# Patient Record
Sex: Female | Born: 1990 | Smoking: Never smoker
Health system: Southern US, Community
[De-identification: ages and names within clinical notes are randomized; demographics above are authoritative.]

## PROBLEM LIST (undated history)

## (undated) DIAGNOSIS — N760 Acute vaginitis: Secondary | ICD-10-CM

## (undated) DIAGNOSIS — B977 Papillomavirus as the cause of diseases classified elsewhere: Secondary | ICD-10-CM

## (undated) DIAGNOSIS — Z789 Other specified health status: Secondary | ICD-10-CM

## (undated) DIAGNOSIS — Z8619 Personal history of other infectious and parasitic diseases: Secondary | ICD-10-CM

## (undated) DIAGNOSIS — B9689 Other specified bacterial agents as the cause of diseases classified elsewhere: Secondary | ICD-10-CM

## (undated) DIAGNOSIS — R87619 Unspecified abnormal cytological findings in specimens from cervix uteri: Secondary | ICD-10-CM

## (undated) HISTORY — DX: Papillomavirus as the cause of diseases classified elsewhere: B97.7

## (undated) HISTORY — DX: Unspecified abnormal cytological findings in specimens from cervix uteri: R87.619

## (undated) HISTORY — DX: Personal history of other infectious and parasitic diseases: Z86.19

---
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
## 2010-09-07 LAB — GC/CHLAMYDIA PROBE AMP, GENITAL
Chlamydia, DNA Probe: NEGATIVE
GC Probe Amp, Genital: NEGATIVE

## 2010-09-07 LAB — WET PREP, GENITAL: Yeast Wet Prep HPF POC: NONE SEEN

## 2011-03-08 ENCOUNTER — Encounter: Payer: Self-pay | Admitting: Internal Medicine

## 2011-03-08 ENCOUNTER — Ambulatory Visit (INDEPENDENT_AMBULATORY_CARE_PROVIDER_SITE_OTHER): Payer: Federal, State, Local not specified - PPO | Admitting: Internal Medicine

## 2011-03-08 DIAGNOSIS — R002 Palpitations: Secondary | ICD-10-CM

## 2011-03-08 DIAGNOSIS — Z0000 Encounter for general adult medical examination without abnormal findings: Secondary | ICD-10-CM | POA: Insufficient documentation

## 2011-03-08 DIAGNOSIS — Z136 Encounter for screening for cardiovascular disorders: Secondary | ICD-10-CM

## 2011-03-08 DIAGNOSIS — K219 Gastro-esophageal reflux disease without esophagitis: Secondary | ICD-10-CM | POA: Insufficient documentation

## 2011-03-08 LAB — CBC WITH DIFFERENTIAL/PLATELET
Basophils Relative: 0.5 % (ref 0.0–3.0)
Eosinophils Absolute: 0 10*3/uL (ref 0.0–0.7)
Eosinophils Relative: 0.4 % (ref 0.0–5.0)
Hemoglobin: 12.4 g/dL (ref 12.0–15.0)
Lymphocytes Relative: 34.7 % (ref 12.0–46.0)
MCHC: 33.1 g/dL (ref 30.0–36.0)
MCV: 89.1 fl (ref 78.0–100.0)
Neutro Abs: 3.2 10*3/uL (ref 1.4–7.7)
Neutrophils Relative %: 53.2 % (ref 43.0–77.0)
RBC: 4.2 Mil/uL (ref 3.87–5.11)
WBC: 6.1 10*3/uL (ref 4.5–10.5)

## 2011-03-08 LAB — BASIC METABOLIC PANEL
CO2: 27 mEq/L (ref 19–32)
Calcium: 8.8 mg/dL (ref 8.4–10.5)
GFR: 155.87 mL/min (ref >60.00)
Glucose, Bld: 74 mg/dL (ref 70–99)
Potassium: 3.9 mEq/L (ref 3.5–5.1)
Sodium: 139 mEq/L (ref 135–145)

## 2011-03-08 LAB — LIPID PANEL: Total CHOL/HDL Ratio: 2

## 2011-03-08 MED ORDER — ESOMEPRAZOLE MAGNESIUM 40 MG PO CPDR: 40.0000 mg | DELAYED_RELEASE_CAPSULE | Freq: Every day | ORAL | Status: DC

## 2011-03-08 NOTE — Assessment & Plan Note (Addendum)
Frequent heartburn, recommend PPI for 6 weeks and then as needed. Samples and a prescription provided (nexium). Life style changes discussed

## 2011-03-08 NOTE — Assessment & Plan Note (Signed)
EKG normal sinus, no acute changes. Recommend to treat GERD, if symptoms continue will have to reassess the situation

## 2011-03-08 NOTE — Progress Notes (Signed)
  Subjective:    Patient ID: Sally Sexton, female    DOB: Sep 23, 1990, 20 y.o.   MRN: [id number]  HPI New patient, requests a complete physical exam For a while has noted palpitations ( described as increasing the heart rate) and chest pain after eating, symptoms last x5-10 minutes, and they are getting more frequent in the last 2 months. Denies associated odynophagia, faintiness, syncope, shortness of breath or cough. She does have some heartburn when the symptoms happen.  Past Medical History  Diagnosis Date  . History of chicken pox    Past Surgical History  Procedure Date  . No past surgeries    History   Social History  . Marital Status: Single    Spouse Name: N/A    Number of Children: 0  . 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 Smoking status: Never Smoker   . Smokeless tobacco: Never Used  . Alcohol Use: Yes     socially   . Drug Use: No  . Sexually Active: Not on file   Other Topics Concern  . Not on file   Social History Narrative   [place] from [place]--- diet:regular, ++ fast food ---exercise: no   Family History  Problem Relation Age of Onset  . Coronary artery disease Neg Hx   . Stroke Neg Hx   . Diabetes Neg Hx   . Colon cancer Neg Hx   . Breast cancer Neg Hx     Review of Systems No fever or or chills No cough or shortness of breath No nausea, vomiting, diarrhea or blood in the stools She experienced classic heartburn at least once a week (independent from palpitations and chest pain) she used to take medication for heartburn before. Denies depression, occasionally has some anxiety but she handles that well. Periods are regular and not heavy.     Objective:   Physical Exam  Constitutional: She is oriented to person, place, and time. She appears well-developed and well-nourished. No distress.  HENT:  Head: Normocephalic and atraumatic.  Neck: Normal range of motion.  Neck supple. No thyromegaly present.  Cardiovascular: Normal rate, regular rhythm and normal heart sounds.   No murmur heard. Pulmonary/Chest: Effort normal and breath sounds normal. No respiratory distress. She has no wheezes. She has no rales.  Abdominal: Soft. Bowel sounds are normal. She exhibits no distension. There is no tenderness. There is no rebound and no guarding.  Musculoskeletal: She exhibits no edema.  Neurological: She is alert and oriented to person, place, and time.  Skin: She is not diaphoretic.  Psychiatric: She has a normal mood and affect. Her behavior is normal. Judgment and thought content normal.          Assessment & Plan:

## 2011-03-08 NOTE — Progress Notes (Signed)
  Subjective:    Patient ID: Sally Sexton, female    DOB: Feb 06, 1991, 20 y.o.   MRN: [id number]  HPI    Review of Systems     Objective:   Physical Exam        Assessment & Plan:

## 2011-03-08 NOTE — Patient Instructions (Addendum)
nexium (or prilosec 20 mg OTC if nexium $$$) before breakfast x 6 weeks, then as needed . Call if chest pain continue

## 2011-03-08 NOTE — Assessment & Plan Note (Addendum)
TD ~ 3 years per pt Had menactra before she went to college per patient Declined a flu shot saw gynecology a  few months ago for a female checkup, no labs were performed Diet-exercise  discussed General labs

## 2011-05-02 ENCOUNTER — Emergency Department (HOSPITAL_COMMUNITY)
Admission: EM | Admit: 2011-05-02 | Discharge: 2011-05-02 | Discharge status: Against Medical Advice | Payer: Federal, State, Local not specified - PPO | Attending: Emergency Medicine | Admitting: Emergency Medicine

## 2011-05-02 ENCOUNTER — Encounter (HOSPITAL_COMMUNITY): Payer: Self-pay

## 2011-05-02 DIAGNOSIS — M549 Dorsalgia, unspecified: Secondary | ICD-10-CM | POA: Insufficient documentation
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
## 2011-06-23 ENCOUNTER — Encounter (HOSPITAL_COMMUNITY): Payer: Self-pay | Admitting: *Deleted

## 2011-06-23 ENCOUNTER — Other Ambulatory Visit (HOSPITAL_COMMUNITY): Payer: Self-pay | Admitting: Pharmacy Technician

## 2011-06-23 ENCOUNTER — Emergency Department (HOSPITAL_COMMUNITY)
Admission: EM | Admit: 2011-06-23 | Discharge: 2011-06-23 | Disposition: A | Discharge status: Home / Self Care | Payer: Federal, State, Local not specified - PPO | Attending: Emergency Medicine | Admitting: Emergency Medicine

## 2011-06-23 DIAGNOSIS — R6883 Chills (without fever): Secondary | ICD-10-CM | POA: Insufficient documentation

## 2011-06-23 DIAGNOSIS — R110 Nausea: Secondary | ICD-10-CM | POA: Insufficient documentation

## 2011-06-23 DIAGNOSIS — N898 Other specified noninflammatory disorders of vagina: Secondary | ICD-10-CM | POA: Insufficient documentation

## 2011-06-23 DIAGNOSIS — R109 Unspecified abdominal pain: Secondary | ICD-10-CM | POA: Insufficient documentation

## 2011-06-23 LAB — URINALYSIS, ROUTINE W REFLEX MICROSCOPIC
Glucose, UA: NEGATIVE mg/dL
Hgb urine dipstick: NEGATIVE
Leukocytes, UA: NEGATIVE
Protein, ur: NEGATIVE mg/dL
pH: 6 (ref 5.0–8.0)

## 2011-06-23 LAB — WET PREP, GENITAL
Trich, Wet Prep: NONE SEEN
Yeast Wet Prep HPF POC: NONE SEEN

## 2011-06-23 MED ORDER — ONDANSETRON 8 MG PO TBDP
8.0000 mg | ORAL_TABLET | Freq: Once | ORAL | Status: AC
  Administered 2011-06-23: 8 mg via ORAL
  Filled 2011-06-23: qty 1

## 2011-06-23 MED ORDER — ONDANSETRON 8 MG PO TBDP: 8.0000 mg | ORAL_TABLET | Freq: Three times a day (TID) | ORAL | Status: AC | PRN

## 2011-06-23 NOTE — ED Provider Notes (Signed)
History     CSN: [id number]  Arrival date & time 06/23/11  1919   First MD Initiated Contact with Patient 06/23/11 1943      Chief Complaint  Patient presents with  . Chills    pt reports having hot and cold flashes all day while at work. reports drinking ETOH last night but denies "normal hangover feeling." denies n/v. reports shaky feeling.     (Consider location/radiation/quality/duration/timing/severity/associated sxs/prior treatment) HPI History provided by pt.   Pt has had intermittent crampy and sharp, mid-abdominal pain since approx 4:00pm day.  Associated w/ nausea but no vomiting or diarrhea.  No GU sx w/ exception that her most recent period came 1wk late and was shorter than normal.  Started birth control yesterday but had not used any form of contraceptive prior.  Denies fever but has had alternating chills and diaphoresis since the pain started.  Pt has no PMH.  No h/o abdominal surgeries.    Past Medical History  Diagnosis Date  . History of chicken pox     Past Surgical History  Procedure Date  . No past surgeries     Family History  Problem Relation Age of Onset  . Coronary artery disease Neg Hx   . Stroke Neg Hx   . Diabetes Neg Hx   . Colon cancer Neg Hx   . Breast cancer Neg Hx     History  Substance Use Topics  . Smoking status: Never Smoker   . Smokeless tobacco: Never Used  . Alcohol Use: Yes     socially     OB History    Grav Para Term Preterm Abortions TAB SAB Ect Mult Living                  Review of Systems  All other systems reviewed and are negative.    Allergies  Review of patient's allergies indicates no known allergies.  Home Medications   Current Outpatient Rx  Name Route Sig Dispense Refill  . ESOMEPRAZOLE MAGNESIUM 40 MG PO CPDR Oral Take 1 capsule (40 mg total) by mouth daily. 30 capsule 6  . PRESCRIPTION MEDICATION  BCP- name of medication unknown       BP 119/60  Pulse 82  Temp(Src) 98.4 F (36.9 C) (Oral)   Resp 20  Wt 135 lb 8 oz (61.462 kg)  SpO2 100%  LMP 06/19/2011  Physical Exam  Nursing note and vitals reviewed. Constitutional: She is oriented to person, place, and time. She appears well-developed and well-nourished. No distress.  HENT:  Head: Normocephalic and atraumatic.  Eyes:       Normal appearance  Neck: Normal range of motion.  Cardiovascular: Normal rate and regular rhythm.   Pulmonary/Chest: Effort normal and breath sounds normal.  Abdominal: Soft. Bowel sounds are normal. She exhibits no distension and no mass. There is no tenderness. There is no rebound and no guarding.       No CVA tenderness  Genitourinary:       Nml external genitalia.  Physiologic vaginal discharge but no bleeding.  Cervix closed and appears nml.  No adnexal or cervical motion tenderess.    Neurological: She is alert and oriented to person, place, and time.  Skin: Skin is warm and dry. No rash noted.  Psychiatric: She has a normal mood and affect. Her behavior is normal.    ED Course  Procedures (including critical care time)  Labs Reviewed  URINALYSIS, ROUTINE W REFLEX MICROSCOPIC - Abnormal; Notable  for the following:    Ketones, ur 40 (*)    All other components within normal limits  WET PREP, GENITAL - Abnormal; Notable for the following:    Clue Cells Wet Prep HPF POC FEW (*)    WBC, Wet Prep HPF POC FEW (*)    All other components within normal limits  POCT PREGNANCY, URINE  GC/CHLAMYDIA PROBE AMP, GENITAL   No results found.   1. Abdominal pain   2. Nausea       MDM  Healthy 21yo F presents w/ c/o abdominal pain, nausea, lightheadedness and chills/sweats since early this evening.  Afebrile and abd benign/non-tender on exam.  Urine hcg neg but I am still suspicious for early pregnancy (pt just started oral contraceptive yesterday and does not use condoms, LMP 5 days ago but shorter than normal and came one week late).   U/A pending and will perform pelvic exam.     Normal  pelvic exam and U/A and wet prep neg for infection.  Pt reports relief of nausea w/ zofran.  She looks well.  D/c'd home w/ zofran and recommendation to drink plenty of fluids, recheck pregnancy test in one week and f/u with PCP if sx persistent.  Return precautions discussed.   Otilio Miu, [place] 06/24/11 0140

## 2011-06-24 LAB — GC/CHLAMYDIA PROBE AMP, GENITAL: GC Probe Amp, Genital: NEGATIVE

## 2011-06-25 NOTE — ED Provider Notes (Signed)
Medical screening examination/treatment/procedure(s) were performed by non-physician practitioner and as supervising physician I was immediately available for consultation/collaboration.   Jordayn Mink, MD 06/25/11 0715 

## 2011-07-22 ENCOUNTER — Encounter: Payer: Self-pay | Admitting: Family Medicine

## 2011-07-22 ENCOUNTER — Ambulatory Visit (INDEPENDENT_AMBULATORY_CARE_PROVIDER_SITE_OTHER): Payer: Federal, State, Local not specified - PPO | Admitting: Family Medicine

## 2011-07-22 VITALS — BP 100/60 | HR 85 | Temp 98.3°F | Wt 133.2 lb

## 2011-07-22 DIAGNOSIS — J069 Acute upper respiratory infection, unspecified: Secondary | ICD-10-CM

## 2011-07-22 DIAGNOSIS — R05 Cough: Secondary | ICD-10-CM

## 2011-07-22 MED ORDER — CEFUROXIME AXETIL 500 MG PO TABS: 500.0000 mg | ORAL_TABLET | Freq: Two times a day (BID) | ORAL | Status: AC

## 2011-07-22 MED ORDER — GUAIFENESIN-CODEINE 100-10 MG/5ML PO SYRP: ORAL_SOLUTION | ORAL | Status: DC

## 2011-07-22 NOTE — Progress Notes (Signed)
  Subjective:     Sally Sexton is a 21 y.o. female who presents for evaluation of sinus pain. Symptoms include: congestion, cough, facial pain, headaches, nasal congestion and sinus pressure. Onset of symptoms was 3 days ago. Symptoms have been gradually worsening since that time. Past history is significant for no history of pneumonia or bronchitis. Patient is a non-smoker.  The following portions of the patient's history were reviewed and updated as appropriate: allergies, current medications, past family history, past medical history, past social history, past surgical history and problem list.  Review of Systems Pertinent items are noted in HPI.   Objective:    BP 100/60  Pulse 85  Temp(Src) 98.3 F (36.8 C) (Oral)  Wt 133 lb 3.2 oz (60.419 kg)  SpO2 97%  LMP 06/19/2011 General appearance: alert, cooperative, appears stated age and no distress Eyes: conjunctivae/corneas clear. PERRL, EOM's intact. Fundi benign. Ears: normal TM's and external ear canals both ears Nose: clear discharge, mild congestion, sinus tenderness bilateral Throat: lips, mucosa, and tongue normal; teeth and gums normal Neck: no adenopathy and thyroid not enlarged, symmetric, no tenderness/mass/nodules Lungs: clear to auscultation bilaterally Heart: S1, S2 normal    Assessment:    Acute viral sinusitis.    Plan:    Nasal steroids per medication orders. Antihistamines per medication orders. Follow up in a few days or as needed.

## 2011-07-22 NOTE — Patient Instructions (Signed)

## 2011-11-22 ENCOUNTER — Ambulatory Visit: Payer: Federal, State, Local not specified - PPO | Admitting: Internal Medicine

## 2011-11-29 ENCOUNTER — Ambulatory Visit (INDEPENDENT_AMBULATORY_CARE_PROVIDER_SITE_OTHER): Payer: Federal, State, Local not specified - PPO | Admitting: Internal Medicine

## 2011-11-29 ENCOUNTER — Encounter: Payer: Self-pay | Admitting: Internal Medicine

## 2011-11-29 VITALS — BP 104/68 | HR 78 | Temp 98.4°F | Wt 135.0 lb

## 2011-11-29 DIAGNOSIS — J069 Acute upper respiratory infection, unspecified: Secondary | ICD-10-CM

## 2011-11-29 MED ORDER — AMOXICILLIN 500 MG PO CAPS: 1000.0000 mg | ORAL_CAPSULE | Freq: Two times a day (BID) | ORAL | Status: AC

## 2011-11-29 NOTE — Progress Notes (Signed)
  Subjective:    Patient ID: Sally Sexton, female    DOB: 07-Oct-1990, 21 y.o.   MRN: [id number]  HPI Acute visit Symptoms started 2 days ago: Body aches, sore throat, head congestion. Feeling tired.  Past Medical History  Diagnosis Date  . History of chicken pox      Review of Systems No fever or chills No nausea, vomiting, diarrhea. Some cough, no chest congestion but has some phlegm  accumulated in the throat.     Objective:   Physical Exam  General -- alert, well-developed, and well-nourished.   HEENT -- TMs slt bulge, nored, throat w/o redness, face symmetric and not tender to palpation Lungs -- normal respiratory effort, no intercostal retractions, no accessory muscle use, and normal breath sounds.   Heart-- normal rate, regular rhythm, no murmur, and no gallop.   extremities-- no pretibial edema bilaterally Psych-- Cognition and judgment appear intact. Alert and cooperative with normal attention span and concentration.  not anxious appearing and not depressed appearing.       Assessment & Plan:   Symptoms consistent with URI, see instructions.

## 2011-11-29 NOTE — Patient Instructions (Signed)
Rest, fluids , tylenol For cough, take Mucinex DM twice a day as needed  For congestion use nasonex,  2 nasal sprays once a day a day until you feel better (sample) May like to use some Sudafed behind the counter if the congestion is severe Take the antibiotic as prescribed  (Amoxicillin) only if no better in few days  Call if no better in few days Call anytime if the symptoms are severe

## 2012-05-23 ENCOUNTER — Ambulatory Visit (INDEPENDENT_AMBULATORY_CARE_PROVIDER_SITE_OTHER): Payer: Federal, State, Local not specified - PPO | Admitting: Internal Medicine

## 2012-05-23 ENCOUNTER — Encounter: Payer: Self-pay | Admitting: Internal Medicine

## 2012-05-23 VITALS — BP 104/76 | HR 77 | Temp 98.0°F | Wt 143.0 lb

## 2012-05-23 DIAGNOSIS — J029 Acute pharyngitis, unspecified: Secondary | ICD-10-CM

## 2012-05-23 NOTE — Addendum Note (Signed)
Addended by: Edwena Felty T on: 05/23/2012 01:40 PM   Modules accepted: Orders

## 2012-05-23 NOTE — Progress Notes (Signed)
  Subjective:    Patient ID: Sally Sexton, female    DOB: 1990-12-24, 21 y.o.   MRN: [id number]  HPI Acute visit Patient was seen at a UC  in [place] 05/18/2012 with sore throat, tonsils were enlarged and had white patches. They did a finger prick blood test and she was diagnosed with mono. She was also told her spleen was enlarged. Was prescribed prednisone which she is finishing.  Past Medical History  Diagnosis Date  . History of chicken pox    Past Surgical History  Procedure Date  . No past surgeries    History   Social History  . Marital Status: Single    Spouse Name: N/A    Number of Children: 0  . 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 Smoking status: Never Smoker   . Smokeless tobacco: Never Used  . Alcohol Use: Yes     Comment: socially   . Drug Use: No  . Sexually Active: Not on file   Other Topics Concern  . Not on file   Social History Narrative   [place]--- diet:regular, ++ fast food ---exercise: no    Review of Systems At the time of the urgent care visit, she had no fever at all, did not feel fatigue. Didn't have nausea, vomiting, diarrhea. No cough or runny nose. At this point she feels completely well.     Objective:   Physical Exam General -- alert, well-developed, and well-nourished.   HEENT --  throat w/o redness, tonsils small, no discharge; face symmetric and not tender to palpation  Lungs -- normal respiratory effort, no intercostal retractions, no accessory muscle use, and normal breath sounds.   Heart-- normal rate, regular rhythm, no murmur, and no gallop.   Abdomen--soft, non-tender, no distention, no masses, no organomegaly upon palpation or percussion. I put the patient on the  right decubitus and I still was unable to palpate the spleen.   Extremities-- no pretibial edema bilaterally  Psych-- Cognition and judgment appear intact. Alert  and cooperative with normal attention span and concentration.  not anxious appearing and not depressed appearing.      Assessment & Plan:   Pharyngitis, tonsillitis The patient reports a history consistent with pharyngitis, tonsillitis; evidently  a mono spot test was positive but I'm not sure if she actually had mono given the lack of fever and tiredness. She was also told her spleen was enlarged, today examination is normal. Plan: Throat culture, Rx  PCN if appropriate  Avoid any contact sports for the next 4 weeks due to to history of spleen enlargement. This is more a precaution because on today's exam there is no evidence of splenomegaly.

## 2012-05-23 NOTE — Patient Instructions (Addendum)
Please call anytime if you have fever, sore throat, nausea, vomiting, a rash. No contact sports for the next 4 weeks

## 2012-05-26 ENCOUNTER — Encounter: Payer: Self-pay | Admitting: *Deleted

## 2012-05-26 LAB — CULTURE, GROUP A STREP

## 2012-07-05 ENCOUNTER — Ambulatory Visit: Payer: Federal, State, Local not specified - PPO | Admitting: Internal Medicine

## 2012-08-25 ENCOUNTER — Ambulatory Visit (INDEPENDENT_AMBULATORY_CARE_PROVIDER_SITE_OTHER): Payer: Federal, State, Local not specified - PPO | Admitting: Internal Medicine

## 2012-08-25 ENCOUNTER — Encounter: Payer: Self-pay | Admitting: Internal Medicine

## 2012-08-25 VITALS — BP 104/64 | HR 78 | Temp 98.2°F | Ht 68.0 in | Wt 148.0 lb

## 2012-08-25 DIAGNOSIS — N920 Excessive and frequent menstruation with regular cycle: Secondary | ICD-10-CM

## 2012-08-25 DIAGNOSIS — Z0000 Encounter for general adult medical examination without abnormal findings: Secondary | ICD-10-CM

## 2012-08-25 DIAGNOSIS — N926 Irregular menstruation, unspecified: Secondary | ICD-10-CM

## 2012-08-25 DIAGNOSIS — R82998 Other abnormal findings in urine: Secondary | ICD-10-CM

## 2012-08-25 LAB — CBC WITH DIFFERENTIAL/PLATELET
Basophils Relative: 0 % (ref 0–1)
Eosinophils Absolute: 0 10*3/uL (ref 0.0–0.7)
Eosinophils Relative: 1 % (ref 0–5)
Hemoglobin: 12.8 g/dL (ref 12.0–15.0)
Lymphs Abs: 2.9 10*3/uL (ref 0.7–4.0)
MCH: 28.4 pg (ref 26.0–34.0)
MCHC: 33.3 g/dL (ref 30.0–36.0)
MCV: 85.1 fL (ref 78.0–100.0)
Monocytes Relative: 12 % (ref 3–12)
Platelets: 219 10*3/uL (ref 150–400)
RBC: 4.51 MIL/uL (ref 3.87–5.11)

## 2012-08-25 LAB — COMPREHENSIVE METABOLIC PANEL
ALT: 21 U/L (ref 0–35)
AST: 21 U/L (ref 0–37)
Albumin: 4.1 g/dL (ref 3.5–5.2)
Alkaline Phosphatase: 48 U/L (ref 39–117)
BUN: 9 mg/dL (ref 6–23)
Chloride: 105 mEq/L (ref 96–112)
Potassium: 3.9 mEq/L (ref 3.5–5.3)

## 2012-08-25 NOTE — Progress Notes (Signed)
  Subjective:    Patient ID: Sally Sexton, female    DOB: [birth year], 22 y.o.   MRN: [id number]  HPI Complete physical exam  Past Medical History  Diagnosis Date  . History of chicken pox    Past Surgical History  Procedure Laterality Date  . No past surgeries     History   Social History  . Marital Status: Single    Spouse Name: N/A    Number of Children: 0  . Years of Education: N/A   Occupational History  . [employer] , graduate [phone]   . [employer] part time    Social History Main Topics  . Smoking status: Never Smoker   . Smokeless tobacco: Never Used  . Alcohol Use: Yes     Comment: socially   . Drug Use: No  . Sexually Active: Not on file   Other Topics Concern  . Not on file   Social History Narrative   [place] from [place]   G1P0   Exercise: no regular , active at work, plans to start a routine next week   Diet:regular, has cut down on fast food , less fat            Family History  Problem Relation Age of Onset  . Coronary artery disease Neg Hx   . Stroke Neg Hx   . Diabetes Neg Hx   . Colon cancer Neg Hx   . Breast cancer Neg Hx    Review of Systems Denies fever or chills. Did complain of some dizziness and nausea on and off for 2 weeks. Her last normal menstrual period was 07/19/2012, on 08/17/2012 she only spotted. Currently with no vaginal discharge or bleeding. Today, has some low abdominal cramping. No dysuria or gross hematuria. No diarrhea. Denies at site of depression. Occasional cough.     Objective:   Physical Exam General -- alert, well-developed  .   Neck --no thyromegaly , normal carotid pulse  HEENT -- no pale or jaundice Lungs -- normal respiratory effort, no intercostal retractions, no accessory muscle use, and normal breath sounds.   Heart-- normal rate, regular rhythm, no murmur, and no gallop.   Abdomen--soft, non-tender, no distention, no masses, no HSM, no guarding, and no rigidity.   Extremities-- no pretibial edema  bilaterally Neurologic-- alert & oriented X3 and strength normal in all extremities. Psych-- Cognition and judgment appear intact. Alert and cooperative with normal attention span and concentration.  not anxious appearing and not depressed appearing.       Assessment & Plan:

## 2012-08-25 NOTE — Assessment & Plan Note (Addendum)
TD ~ 2010per pt significant wt gain, likely due to lack of exercise. Plans to start a exercise routine-- praised Info regards a healthy diet provided  LMP abnormal, saw the school doctor early this week, blood preg test negative.  UPT today (-) Udip today + leukocytes, checkin a UC[facility] rec  To see gynecology and to call if mild nausea dizziness persist

## 2012-08-25 NOTE — Patient Instructions (Addendum)
Please see your gynecologist

## 2012-08-26 ENCOUNTER — Encounter: Payer: Self-pay | Admitting: Internal Medicine

## 2012-08-26 LAB — URINALYSIS
Ketones, ur: NEGATIVE mg/dL
Nitrite: NEGATIVE
Specific Gravity, Urine: 1.021 (ref 1.005–1.030)
Urobilinogen, UA: 1 mg/dL (ref 0.0–1.0)
pH: 7.5 (ref 5.0–8.0)

## 2012-08-28 ENCOUNTER — Telehealth: Payer: Self-pay | Admitting: *Deleted

## 2012-08-28 LAB — URINE CULTURE: Colony Count: >=100000

## 2012-08-28 MED ORDER — SULFAMETHOXAZOLE-TRIMETHOPRIM 800-160 MG PO TABS: 1.0000 | ORAL_TABLET | Freq: Two times a day (BID) | ORAL | Status: DC

## 2012-08-28 NOTE — Telephone Encounter (Signed)
Wanda Plump, MD - call Bactrim DS 1 po bid x 5 days, #10, no RF   rx sent to pharmacy.

## 2012-10-24 ENCOUNTER — Ambulatory Visit: Payer: Federal, State, Local not specified - PPO | Admitting: Internal Medicine

## 2012-10-24 ENCOUNTER — Ambulatory Visit (INDEPENDENT_AMBULATORY_CARE_PROVIDER_SITE_OTHER): Payer: Federal, State, Local not specified - PPO | Admitting: Emergency Medicine

## 2012-10-24 ENCOUNTER — Other Ambulatory Visit: Payer: Self-pay | Admitting: Emergency Medicine

## 2012-10-24 ENCOUNTER — Ambulatory Visit: Payer: Federal, State, Local not specified - PPO

## 2012-10-24 VITALS — BP 118/64 | HR 82 | Temp 98.7°F | Resp 16 | Ht 68.5 in | Wt 151.0 lb

## 2012-10-24 DIAGNOSIS — R109 Unspecified abdominal pain: Secondary | ICD-10-CM

## 2012-10-24 LAB — POCT UA - MICROSCOPIC ONLY
Casts, Ur, LPF, POC: NEGATIVE
Crystals, Ur, HPF, POC: NEGATIVE
Mucus, UA: NEGATIVE

## 2012-10-24 LAB — POCT CBC
Granulocyte percent: 53.4 %G (ref 37–80)
Hemoglobin: 12.9 g/dL (ref 12.2–16.2)
MCHC: 31 g/dL — AB (ref 31.8–35.4)
MPV: 9.6 fL (ref 0–99.8)
POC Granulocyte: 3.4 (ref 2–6.9)
POC MID %: 8.5 %M (ref 0–12)
RBC: 4.51 M/uL (ref 4.04–5.48)

## 2012-10-24 LAB — POCT URINALYSIS DIPSTICK
Blood, UA: NEGATIVE
Protein, UA: NEGATIVE
Spec Grav, UA: 1.015
Urobilinogen, UA: 0.2
pH, UA: 7

## 2012-10-24 LAB — POCT URINE PREGNANCY: Preg Test, Ur: NEGATIVE

## 2012-10-24 MED ORDER — POLYETHYLENE GLYCOL 3350 17 GM/SCOOP PO POWD: 17.0000 g | Freq: Every day | ORAL | Status: DC

## 2012-10-24 NOTE — Progress Notes (Signed)
[facility] [address] [place] [address] [phone]  Date:  10/24/2012   Name:  Sally Sexton   DOB:  Jun 01, 1990   MRN:  [id number]  PCP:  Willow Ora, MD    Chief Complaint: Abdominal Pain   History of Present Illness:  Sally Sexton is a 22 y.o. very pleasant female patient who presents with the following:  Ill past two weeks with LLQ abdominal pain and cramping.  Hard stools.  Some blood on one occasion.  No fever chills, nausea or vomiting.  Normal appetite.  No food intolerance.  No anal masses or pain.  No cough or coryza or gu or gyn symptoms.  No improvement with over the counter medications or other home remedies. Denies other complaint or health concern today.   Patient Active Problem List   Diagnosis Date Noted  . Annual physical exam 03/08/2011  . GERD (gastroesophageal reflux disease) 03/08/2011    Past Medical History  Diagnosis Date  . History of chicken pox     Past Surgical History  Procedure Laterality Date  . No past surgeries      History  Substance Use Topics  . Smoking status: Never Smoker   . Smokeless tobacco: Never Used  . Alcohol Use: Yes     Comment: socially     Family History  Problem Relation Age of Onset  . Coronary artery disease Neg Hx   . Stroke Neg Hx   . Diabetes Neg Hx   . Colon cancer Neg Hx   . Breast cancer Neg Hx     No Known Allergies  Medication list has been reviewed and updated.  No current outpatient prescriptions on file prior to visit.   No current facility-administered medications on file prior to visit.    Review of Systems:  As per HPI, otherwise negative.    Physical Examination: Filed Vitals:   10/24/12 1357  BP: 118/64  Pulse: 82  Temp: 98.7 F (37.1 C)  Resp: 16   Filed Vitals:   10/24/12 1357  Height: 5' 8.5" (1.74 m)  Weight: 151 lb (68.493 kg)   Body mass index is 22.62 kg/(m^2). Ideal Body Weight: Weight in (lb) to have BMI = 25: 166.5  GEN: WDWN,  NAD, Non-toxic, A & O x 3 HEENT: Atraumatic, Normocephalic. Neck supple. No masses, No LAD. Ears and Nose: No external deformity. CV: RRR, No M/G/R. No JVD. No thrill. No extra heart sounds. PULM: CTA B, no wheezes, crackles, rhonchi. No retractions. No resp. distress. No accessory muscle use. ABD: S, NT, ND, +BS. No rebound. No HSM. EXTR: No c/c/e NEURO Normal gait.  PSYCH: Normally interactive. Conversant. Not depressed or anxious appearing.  Calm demeanor.    Assessment and Plan: Abdominal pain Constipation miralax   Signed,  Phillips Odor, MD   Results for orders placed in visit on 10/24/12  POCT UA - MICROSCOPIC ONLY      Result Value Range   WBC, Ur, HPF, POC 0-2     RBC, urine, microscopic neg     Bacteria, U Microscopic trace     Mucus, UA neg     Epithelial cells, urine per micros 0-3     Crystals, Ur, HPF, POC neg     Casts, Ur, LPF, POC neg     Yeast, UA neg    POCT URINALYSIS DIPSTICK      Result Value Range   Color, UA yellow     Clarity, UA clear  Glucose, UA neg     Bilirubin, UA neg     Ketones, UA neg     Spec Grav, UA 1.015     Blood, UA neg     pH, UA 7.0     Protein, UA neg     Urobilinogen, UA 0.2     Nitrite, UA neg     Leukocytes, UA Negative    POCT CBC      Result Value Range   WBC 6.3  4.6 - 10.2 K/uL   Lymph, poc 2.4  0.6 - 3.4   POC LYMPH PERCENT 38.1  10 - 50 %L   MID (cbc) 0.5  0 - 0.9   POC MID % 8.5  0 - 12 %M   POC Granulocyte 3.4  2 - 6.9   Granulocyte percent 53.4  37 - 80 %G   RBC 4.51  4.04 - 5.48 M/uL   Hemoglobin 12.9  12.2 - 16.2 g/dL   HCT, POC 14.7  82.9 - 47.9 %   MCV 92.2  80 - 97 fL   MCH, POC 28.6  27 - 31.2 pg   MCHC 31.0 (*) 31.8 - 35.4 g/dL   RDW, POC 56.2     Platelet Count, POC 191  142 - 424 K/uL   MPV 9.6  0 - 99.8 fL  POCT URINE PREGNANCY      Result Value Range   Preg Test, Ur Negative     [facility] reading (PRIMARY) by  Dr. Dareen Piano.  Increased stool.  No free air or air fluid levels.

## 2012-10-24 NOTE — Patient Instructions (Addendum)

## 2013-01-15 ENCOUNTER — Encounter (HOSPITAL_COMMUNITY): Payer: Self-pay | Admitting: *Deleted

## 2013-01-15 ENCOUNTER — Other Ambulatory Visit (HOSPITAL_COMMUNITY)
Admission: RE | Admit: 2013-01-15 | Discharge: 2013-01-15 | Disposition: A | Discharge status: Home / Self Care | Payer: Federal, State, Local not specified - PPO | Source: Ambulatory Visit | Attending: Emergency Medicine | Admitting: Emergency Medicine

## 2013-01-15 ENCOUNTER — Emergency Department (INDEPENDENT_AMBULATORY_CARE_PROVIDER_SITE_OTHER)
Admission: EM | Admit: 2013-01-15 | Discharge: 2013-01-15 | Disposition: A | Discharge status: Home / Self Care | Payer: Federal, State, Local not specified - PPO | Source: Home / Self Care | Attending: Emergency Medicine | Admitting: Emergency Medicine

## 2013-01-15 DIAGNOSIS — N898 Other specified noninflammatory disorders of vagina: Secondary | ICD-10-CM

## 2013-01-15 DIAGNOSIS — Z113 Encounter for screening for infections with a predominantly sexual mode of transmission: Secondary | ICD-10-CM | POA: Insufficient documentation

## 2013-01-15 DIAGNOSIS — N760 Acute vaginitis: Secondary | ICD-10-CM | POA: Insufficient documentation

## 2013-01-15 LAB — POCT URINALYSIS DIP (DEVICE)
Bilirubin Urine: NEGATIVE
Leukocytes, UA: NEGATIVE
Nitrite: NEGATIVE
Protein, ur: NEGATIVE mg/dL
pH: 6 (ref 5.0–8.0)

## 2013-01-15 MED ORDER — METRONIDAZOLE 500 MG PO TABS: 500.0000 mg | ORAL_TABLET | Freq: Two times a day (BID) | ORAL | Status: DC

## 2013-01-15 MED ORDER — FLUCONAZOLE 150 MG PO TABS: 150.0000 mg | ORAL_TABLET | Freq: Once | ORAL | Status: AC

## 2013-01-15 NOTE — ED Provider Notes (Signed)
CSN: [id number]     Arrival date & time 01/15/13  1636 History   First MD Initiated Contact with Patient 01/15/13 1655     Chief Complaint  Patient presents with  . Vaginal Discharge   (Consider location/radiation/quality/duration/timing/severity/associated sxs/prior Treatment) HPI Comments: Patient presents urgent care describing a new lead develop vaginal discharge for the last 6-7 days. She describes a for the last 6 months she's been having recurrent vaginal infections with abundant discharge most of the time have been diagnosed with bacterial vaginosis. She has seen her gynecologist because of that and have been even been trying boric acid capsules. She describes on this occasion she does have some mild vaginal itching and discharge is more than usual been white and thick. She has a very low risk or suspicion of having a choir as a transmitted illness.  Patient denies any fevers pelvic pain or urinary symptoms such as increased frequency burning and pressure with urination. She also expresses that she thinks that the last time she used MetroGel she injured herself on the right side of her lobular menorah. And sustained a small cut that since then has been healing but is still feels sore  Patient is a 22 y.o. female presenting with vaginal discharge.  Vaginal Discharge Quality:  Manson Passey, clear, milky, malodorous, thick and white Severity:  Moderate Onset quality:  Gradual Timing:  Constant Progression:  Waxing and waning Context: not after intercourse, not after urination, not at rest, not during bowel movement and not during intercourse   Relieved by:  Nothing Ineffective treatments:  None tried Associated symptoms: no abdominal pain, no dyspareunia, no dysuria, no genital lesions, no nausea, no rash, no urinary incontinence and no vaginal itching   Risk factors: no endometriosis, no foreign body, no gynecological surgery, no immunosuppression, no PID, no prior miscarriage, no STI, no  terminated pregnancy and no unprotected sex     Past Medical History  Diagnosis Date  . History of chicken pox    Past Surgical History  Procedure Laterality Date  . No past surgeries    . Wisdom tooth extraction  2009   Family History  Problem Relation Age of Onset  . Coronary artery disease Neg Hx   . Stroke Neg Hx   . Diabetes Neg Hx   . Colon cancer Neg Hx   . Breast cancer Neg Hx    History  Substance Use Topics  . Smoking status: Never Smoker   . Smokeless tobacco: Never Used  . Alcohol Use: Yes     Comment: socially    OB History   Grav Para Term Preterm Abortions TAB SAB Ect Mult Living                 Review of Systems  Constitutional: Negative for activity change and appetite change.  Gastrointestinal: Negative for nausea and abdominal pain.  Genitourinary: Positive for vaginal discharge. Negative for bladder incontinence, dysuria, urgency, frequency, flank pain, vaginal bleeding, menstrual problem, pelvic pain and dyspareunia.    Allergies  Review of patient's allergies indicates no known allergies.  Home Medications   Current Outpatient Rx  Name  Route  Sig  Dispense  Refill  . fluconazole (DIFLUCAN) 150 MG tablet   Oral   Take 1 tablet (150 mg total) by mouth once. Take second tablet in 7 days   2 tablet   0   . metroNIDAZOLE (FLAGYL) 500 MG tablet   Oral   Take 500 mg by mouth 3 (three) times daily.         Marland Kitchen  metroNIDAZOLE (FLAGYL) 500 MG tablet   Oral   Take 1 tablet (500 mg total) by mouth 2 (two) times daily.   14 tablet   0   . polyethylene glycol powder (GLYCOLAX/MIRALAX) powder   Oral   Take 17 g by mouth daily.   3350 g   1    BP 107/62  Pulse 64  Temp(Src) 98.8 F (37.1 C) (Oral)  Resp 12  SpO2 99%  LMP 12/11/2012 Physical Exam  Vitals reviewed. Constitutional: Vital signs are normal. She appears well-developed and well-nourished.  Non-toxic appearance. She does not have a sickly appearance. She does not appear ill.  No distress.  Abdominal: Hernia confirmed negative in the right inguinal area and confirmed negative in the left inguinal area.  Genitourinary:    No labial fusion. There is no rash, tenderness, lesion or injury on the right labia. There is no rash, tenderness or injury on the left labia. No erythema, tenderness or bleeding around the vagina. No foreign body around the vagina. No signs of injury around the vagina. Vaginal discharge found.  Musculoskeletal: She exhibits tenderness.  Lymphadenopathy:       Right: No inguinal adenopathy present.       Left: No inguinal adenopathy present.  Neurological: She is alert.  Skin: No rash noted. No erythema.    ED Course  Procedures (including critical care time) Labs Review Labs Reviewed  POCT URINALYSIS DIP (DEVICE)  POCT PREGNANCY, URINE  CERVICOVAGINAL ANCILLARY ONLY   Imaging Review No results found.  MDM   1. Vaginal discharge    Recurrent vaginitis.  Exam was somewhat consistent with bacterial vaginosis versus Candida vaginitis. Will treat patient empirically for both. Patient agrees to do so. She has been otherwise if she will be contacted if abnormal test results will require further treatment. She has been instructed to also call her gynecologist as she continues to experience this recurrent episodes of vaginitis. She agrees.  Discharge Medication List as of 01/15/2013  5:36 PM    START taking these medications   Details  fluconazole (DIFLUCAN) 150 MG tablet Take 1 tablet (150 mg total) by mouth once. Take second tablet in 7 days, Starting 01/15/2013, Print    !! metroNIDAZOLE (FLAGYL) 500 MG tablet Take 1 tablet (500 mg total) by mouth 2 (two) times daily., Starting 01/15/2013, Until Discontinued, Print     !! - Potential duplicate medications found. Please discuss with provider.        Jimmie Molly, MD 01/15/13 [phone]

## 2013-01-15 NOTE — ED Notes (Signed)
C/o vaginal discharge for 1 week.  States she gets bacterial vaginosis every month.  C/o vaginal itching and discharge is more than usual, white and thick.  She thinks she has a yeast infection also.

## 2013-01-16 NOTE — ED Notes (Signed)
GC/Chlamydia neg., Affirm: Candida and Trich neg., Gardnerella pos. Pt. adequately treated with Flagyl. [name] 01/16/2013

## 2013-01-31 ENCOUNTER — Telehealth: Payer: Self-pay | Admitting: Internal Medicine

## 2013-01-31 NOTE — Telephone Encounter (Signed)
Patient Information:  Caller Name: Sally Sexton  Phone: [phone]  Patient: Sally Sexton  Gender: Female  DOB: 04-Apr-1991  Age: 22 Years  PCP: Willow Ora  Pregnant: No  Office Follow Up:  Does the office need to follow up with this patient?: No  Instructions For The Office: N/A  RN Note:  Used Numbness/Tingling protocol. Disposition: See provider within 72 hours due to gradual onset numbness in an extremity. Will call office 9-11 to schedule as wants afternoon appointment.  Symptoms  Reason For Call & Symptoms: Has noticed numbness below elbow ffor past "2 weeks". Is intermittent. Thinks has more numbness after certain foods "but can't be sure".  Reviewed Health History In EMR: N/A  Reviewed Medications In EMR: N/A  Reviewed Allergies In EMR: Yes  Reviewed Surgeries / Procedures: Yes  Date of Onset of Symptoms: 01/17/2013 OB / GYN:  LMP: 01/22/2013  Guideline(s) Used:  No Protocol Available - Sick Adult  Disposition Per Guideline:   See Within 3 Days in Office  Reason For Disposition Reached:   Nursing judgment  Advice Given:  N/A  RN Overrode Recommendation:  Follow Up With Office Later  Will call office 9-11 to schedule own appointment.

## 2013-01-31 NOTE — Telephone Encounter (Signed)
It is noted that the patient will call the office tomorrow.      KP

## 2013-02-01 NOTE — Telephone Encounter (Signed)
[place], call pt, needs an appointment

## 2013-02-02 ENCOUNTER — Telehealth: Payer: Self-pay | Admitting: *Deleted

## 2013-02-02 NOTE — Telephone Encounter (Signed)
Appt made for 02/03/13 at [facility] for Saturday clinic

## 2013-02-02 NOTE — Telephone Encounter (Signed)
Spoke with pt who is willing to be seen at Saturday clinic by Dr. Selena Batten. Schedule not yet open, advised that I would call her back to schedule appt later today.

## 2013-02-02 NOTE — Telephone Encounter (Signed)
Pt scheduled for Saturday clinic w/Padonda Orvan Falconer.

## 2013-02-03 ENCOUNTER — Ambulatory Visit: Payer: Federal, State, Local not specified - PPO | Admitting: Family

## 2013-02-07 ENCOUNTER — Encounter: Payer: Self-pay | Admitting: Nurse Practitioner

## 2013-02-07 ENCOUNTER — Ambulatory Visit (INDEPENDENT_AMBULATORY_CARE_PROVIDER_SITE_OTHER): Payer: Federal, State, Local not specified - PPO | Admitting: Nurse Practitioner

## 2013-02-07 VITALS — BP 113/69 | HR 76 | Temp 98.0°F | Ht 68.5 in | Wt 158.8 lb

## 2013-02-07 DIAGNOSIS — K219 Gastro-esophageal reflux disease without esophagitis: Secondary | ICD-10-CM

## 2013-02-07 DIAGNOSIS — Z8659 Personal history of other mental and behavioral disorders: Secondary | ICD-10-CM

## 2013-02-07 DIAGNOSIS — J039 Acute tonsillitis, unspecified: Secondary | ICD-10-CM

## 2013-02-07 MED ORDER — OMEPRAZOLE 40 MG PO CPDR: 40.0000 mg | DELAYED_RELEASE_CAPSULE | Freq: Every day | ORAL | Status: DC

## 2013-02-07 NOTE — Patient Instructions (Addendum)
I think your arm numbness is related to muscle tightness that occurs from hours of sitting at your desk. Stretch for 5 minutes every hour-upper body, shoulder & neck stretches. It is possible that arm numbness is related to anxiety. Use deep breathing techniques if you feel a pins & needles sensation. Consider seeing your therapist again if these episodes occur more than 1-2 times monthly.  Regarding reflux, start omeprazole daily to create a low acid environment. Please follow up with Dr. Drue Novel in 4 weeks to determine if you need to continue omeprazole. Also, eat at least 1-2 spoons full of vanilla yogurt before drinking coffee in the morning. Abstain from alcoholic beverages for a few weeks, then limit to 1 drink daily. Do not eat until you feel overly full-keep meals small. Take tylenol for headache. Avoid ibuprophen, aspirin, aleve, and excedrin for a few weeks. If you must use ibuprophen for menstrual cramps, try decreasing dose to 1 or 2 tabs & take with food. Take 30 minute walk every day. This will help with weight gain and improve digestion. For tonsillitis, start Neilmed sinus washes daily. Salt water gargles several times daily:1/4 tsp salt with 1/4 cup warm water. Also use listerene gargles to decrease bacteria.  Gastroesophageal Reflux Disease, Adult Gastroesophageal reflux disease (GERD) happens when acid from your stomach flows up into the esophagus. When acid comes in contact with the esophagus, the acid causes soreness (inflammation) in the esophagus. Over time, GERD may create small holes (ulcers) in the lining of the esophagus. CAUSES   Increased body weight. This puts pressure on the stomach, making acid rise from the stomach into the esophagus.  Smoking. This increases acid production in the stomach.  Drinking alcohol. This causes decreased pressure in the lower esophageal sphincter (valve or ring of muscle between the esophagus and stomach), allowing acid from the stomach into the  esophagus.  Late evening meals and a full stomach. This increases pressure and acid production in the stomach.  A malformed lower esophageal sphincter. Sometimes, no cause is found. SYMPTOMS   Burning pain in the lower part of the mid-chest behind the breastbone and in the mid-stomach area. This may occur twice a week or more often.  Trouble swallowing.  Sore throat.  Dry cough.  Asthma-like symptoms including chest tightness, shortness of breath, or wheezing. DIAGNOSIS  Your caregiver may be able to diagnose GERD based on your symptoms. In some cases, X-rays and other tests may be done to check for complications or to check the condition of your stomach and esophagus. TREATMENT  Your caregiver may recommend over-the-counter or prescription medicines to help decrease acid production. Ask your caregiver before starting or adding any new medicines.  HOME CARE INSTRUCTIONS   Change the factors that you can control. Ask your caregiver for guidance concerning weight loss, quitting smoking, and alcohol consumption.  Avoid foods and drinks that make your symptoms worse, such as:  Caffeine or alcoholic drinks.  Chocolate.  Peppermint or mint flavorings.  Garlic and onions.  Spicy foods.  Citrus fruits, such as oranges, lemons, or limes.  Tomato-based foods such as sauce, chili, salsa, and pizza.  Fried and fatty foods.  Avoid lying down for the 3 hours prior to your bedtime or prior to taking a nap.  Eat small, frequent meals instead of large meals.  Wear loose-fitting clothing. Do not wear anything tight around your waist that causes pressure on your stomach.  Raise the head of your bed 6 to 8 inches with  wood blocks to help you sleep. Extra pillows will not help.  Only take over-the-counter or prescription medicines for pain, discomfort, or fever as directed by your caregiver.  Do not take aspirin, ibuprofen, or other nonsteroidal anti-inflammatory drugs (NSAIDs). SEEK  IMMEDIATE MEDICAL CARE IF:   You have pain in your arms, neck, jaw, teeth, or back.  Your pain increases or changes in intensity or duration.  You develop nausea, vomiting, or sweating (diaphoresis).  You develop shortness of breath, or you faint.  Your vomit is green, yellow, black, or looks like coffee grounds or blood.  Your stool is red, bloody, or black. These symptoms could be signs of other problems, such as heart disease, gastric bleeding, or esophageal bleeding. MAKE SURE YOU:   Understand these instructions.  Will watch your condition.  Will get help right away if you are not doing well or get worse. Document Released: 02/17/2005 Document Revised: 08/02/2011 Document Reviewed: 11/27/2010 [facility] Patient Information 2014 [place], [place].

## 2013-02-07 NOTE — Progress Notes (Signed)
Subjective:     Sally Sexton is an 22 y.o. female who presents with multiple complaints: L arm numbness that occurs while at work only, occasional pins & needles sensation in legs, full feeling in neck times 1 day, bloating and heartburn, occasional palpitations. She has been treated for anxiety in the past with medication and CBT. She denies history of panick attacks. Her reflux and occasional palpitations have been present for at least 2 years. She had normal ECG in 2012. She was prescribed PPI 2 years ago, but does not remember if she took it. She drinks coffee daily, ETOH every other day, took excedrin several times weekly while in college (1 year ago). Currently uses 600 mg ibuprophen once monthly for [facility]. She has gained 20 pounds in the last year. 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  She denies chest pain, diaphoresis and SOB associated with palpitations. She does not associate palpitations with any particular activity.  The following portions of the patient's history were reviewed and updated as appropriate: allergies, current medications, past medical history, past social history, past surgical history and problem list.  Review of Systems Constitutional: positive for weight gain, negative for fatigue, fevers and night sweats Respiratory: negative for cough, dyspnea on exertion, sputum and wheezing Cardiovascular: positive for palpitations, negative for chest pain, dyspnea, exertional chest pressure/discomfort, fatigue, lower extremity edema and near-syncope Gastrointestinal: positive for dyspepsia and reflux symptoms, negative for change in bowel habits, constipation, diarrhea and nausea Hematologic/lymphatic: positive for felt like glands in neck were swollen yesterday, negative for easy bruising and petechiae Musculoskeletal:positive for pins & needles sensation in legs, negative for arthralgias and muscle weakness Neurological: positive  for paresthesia, negative for dizziness, seizures, tremors and weakness Behavioral/Psych: feels anxious at times, worries about health, treated for anxiety in past w/meds & CBT Endocrine: negative for temperature intolerance Allergic/Immunologic: negative, c/o itchy ears   Objective:     BP 113/69  Pulse 76  Temp(Src) 98 F (36.7 C) (Oral)  Ht 5' 8.5" (1.74 m)  Wt 158 lb 12.8 oz (72.031 kg)  BMI 23.79 kg/m2  SpO2 98%  LMP 12/22/2012 General appearance: alert, cooperative, appears stated age and no distress Head: Normocephalic, without obvious abnormality, atraumatic Eyes: negative findings: lids and lashes normal, conjunctivae and sclerae normal and corneas clear Ears: normal TM's and external ear canals both ears Nose: Nares normal. Septum midline. Mucosa normal. No drainage or sinus tenderness. Throat: abnormal findings: scant exudate L tonsil. tonsils +2 Neck: trachea midline, thyroid feels full, anterior cervical LAD L Back: no kyphosis present, no scoliosis present, no skin lesions, erythema, or scars, no tenderness to percussion or palpation, range of motion normal, symmetric, no curvature. ROM normal. No CVA tenderness. Extremities: extremities normal, atraumatic, no cyanosis or edema Skin: Skin color, texture, turgor normal. No rashes or lesions Lymph nodes: Cervical adenopathy: L anterior cervical LAD   Assessment:    Gastroesophageal Reflux Disease- heartburn, bloating Weight gain 20 pounds in 1 year, TSH normal 4/14 Anxiety-pins &needles sensation in legs, occasional palpitations Tonsillitis-exudate L tonsil, L anterior LAD L arm numbness-occurs at work, normal ECG 2012 WG:[id number] tension, anxiety    Plan:   1 omeprazole times 4 weeks, diet changes, f/u 4 weeks. See pt instruction 2 Exercise 30 minutes daily, portion control 3 deep breathing, consider CBT again 4 nasal saline washes, salt water & listerene rinses 5 upper body stretches every hour while at work

## 2013-03-29 ENCOUNTER — Other Ambulatory Visit: Payer: Self-pay

## 2013-05-08 ENCOUNTER — Encounter: Payer: Self-pay | Admitting: Internal Medicine

## 2013-05-08 ENCOUNTER — Other Ambulatory Visit: Payer: Self-pay | Admitting: Internal Medicine

## 2013-05-08 ENCOUNTER — Ambulatory Visit (INDEPENDENT_AMBULATORY_CARE_PROVIDER_SITE_OTHER): Payer: Federal, State, Local not specified - PPO | Admitting: Internal Medicine

## 2013-05-08 VITALS — BP 109/67 | HR 80 | Temp 98.0°F | Wt 157.0 lb

## 2013-05-08 DIAGNOSIS — D229 Melanocytic nevi, unspecified: Secondary | ICD-10-CM | POA: Insufficient documentation

## 2013-05-08 DIAGNOSIS — R002 Palpitations: Secondary | ICD-10-CM

## 2013-05-08 DIAGNOSIS — D239 Other benign neoplasm of skin, unspecified: Secondary | ICD-10-CM

## 2013-05-08 DIAGNOSIS — K219 Gastro-esophageal reflux disease without esophagitis: Secondary | ICD-10-CM

## 2013-05-08 DIAGNOSIS — Z8659 Personal history of other mental and behavioral disorders: Secondary | ICD-10-CM

## 2013-05-08 MED ORDER — OMEPRAZOLE 40 MG PO CPDR
40.0000 mg | DELAYED_RELEASE_CAPSULE | Freq: Every day | ORAL | Status: DC
Start: 2013-05-08 — End: 2013-05-08

## 2013-05-08 NOTE — Progress Notes (Signed)
   Subjective:    Patient ID: Sally Sexton, female    DOB: 1991-05-21, 22 y.o.   MRN: [id number]  HPI Acute visit to discuss several issues. Several months history of GERD described as acid regurgitation and burning in her throat, previously tried omeprazole, she took it after her coffee in the morning, did not help. Currently taking only OTCs. Symptoms are triggered by certain foods and alcohol. Also, has noted palpitations on and off, usually at rest, heart rate has been as high as 121,, not associated with chest pain, SOB; reports that when she feels her heart rate going up she feels anxious but is not anxious before the symptoms. Also has several moles, there is one in particular at the  right foot, it has not change in size color or shape in years..  Past Medical History  Diagnosis Date  . History of chicken pox    Past Surgical History  Procedure Laterality Date  . Wisdom tooth extraction  2009   History   Social History  . Marital Status: Single    Spouse Name: N/A    Number of Children: 0  . Years of Education: N/A   Occupational History  . [employer] Laws , graduate [phone]   . [employer]   .     Social History Main Topics  . Smoking status: Never Smoker   . Smokeless tobacco: Never Used  . Alcohol Use: Yes     Comment: socially   . Drug Use: No  . Sexual Activity: Yes    Birth Control/ Protection: None   Other Topics Concern  . Not on file   Social History Narrative   Original from [place]   G1P0      Review of Systems Denies weight loss or tremors. Drinks one coffee a day, and no other caffeine beverages. Denies any syncope, she exercises on and off without apparent problems. On further questioning, she does have low tolerance to anxiety, she can get very anxious with minor issues.     Objective:   Physical Exam BP 109/67  Pulse 80  Temp(Src) 98 F (36.7 C)  Wt 157 lb (71.215 kg)  SpO2 100% General -- alert, well-developed, NAD.  Neck --no  thyromegaly  Lungs -- normal respiratory effort, no intercostal retractions, no accessory muscle use, and normal breath sounds.  Heart-- normal rate, regular rhythm, no murmur.  Abdomen-- Not distended, good bowel sounds,soft, non-tender.  Extremities-- no pretibial edema bilaterally  Neurologic--  alert & oriented X3. Speech normal, gait normal, strength normal in all extremities. No tremor. Skin--  she has a x10 mm mole at the plantar area of the right foot, not elevated. Psych-- Cognition and judgment appear intact. Cooperative with normal attention span and concentration. Slt anxious appearing , no depressed appearing.      Assessment & Plan:

## 2013-05-08 NOTE — Telephone Encounter (Signed)
rx refilled per protocol. DJR  

## 2013-05-08 NOTE — Assessment & Plan Note (Signed)
Patient has a number of moles, surveillance recommended

## 2013-05-08 NOTE — Assessment & Plan Note (Signed)
The patient is probably more  anxious than what she thinks, recommend to see her counselor

## 2013-05-08 NOTE — Progress Notes (Signed)
Pre visit review using our clinic review tool, if applicable. No additional management support is needed unless otherwise documented below in the visit note. 

## 2013-05-08 NOTE — Assessment & Plan Note (Addendum)
Recurrent issue, Previously CBC and TSH normal. EKG today nsr  Related to anxiety?. Plan: Holter monitor

## 2013-05-08 NOTE — Patient Instructions (Signed)
Start omeprazole 40 mg one tablet It is before breakfast Avoid any habits that cause acid reflux Come back in 6 weeks        Diet for Gastroesophageal Reflux Disease, Adult Reflux (acid reflux) is when acid from your stomach flows up into the esophagus. When acid comes in contact with the esophagus, the acid causes irritation and soreness (inflammation) in the esophagus. When reflux happens often or so severely that it causes damage to the esophagus, it is called gastroesophageal reflux disease (GERD). Nutrition therapy can help ease the discomfort of GERD. FOODS OR DRINKS TO AVOID OR LIMIT  Smoking or chewing tobacco. Nicotine is one of the most potent stimulants to acid production in the gastrointestinal tract.  Caffeinated and decaffeinated coffee and black tea.  Regular or low-calorie carbonated beverages or energy drinks (caffeine-free carbonated beverages are allowed).   Strong spices, such as black pepper, white pepper, red pepper, cayenne, curry powder, and chili powder.  Peppermint or spearmint.  Chocolate.  High-fat foods, including meats and fried foods. Extra added fats including oils, butter, salad dressings, and nuts. Limit these to less than 8 tsp per day.  Fruits and vegetables if they are not tolerated, such as citrus fruits or tomatoes.  Alcohol.  Any food that seems to aggravate your condition. If you have questions regarding your diet, call your caregiver or a registered dietitian. OTHER THINGS THAT MAY HELP GERD INCLUDE:   Eating your meals slowly, in a relaxed setting.  Eating 5 to 6 small meals per day instead of 3 large meals.  Eliminating food for a period of time if it causes distress.  Not lying down until 3 hours after eating a meal.  Keeping the head of your bed raised 6 to 9 inches (15 to 23 cm) by using a foam wedge or blocks under the legs of the bed. Lying flat may make symptoms worse.  Being physically active. Weight loss may be helpful  in reducing reflux in overweight or obese adults.  Wear loose fitting clothing EXAMPLE MEAL PLAN This meal plan is approximately 2,000 calories based on https://www.bernard.org/ meal planning guidelines. Breakfast   cup cooked oatmeal.  1 cup strawberries.  1 cup low-fat milk.  1 oz almonds. Snack  1 cup cucumber slices.  6 oz yogurt (made from low-fat or fat-free milk). Lunch  2 slice whole-wheat bread.  2 oz sliced [place].  2 tsp mayonnaise.  1 cup blueberries.  1 cup snap peas. Snack  6 whole-wheat crackers.  1 oz string cheese. Dinner   cup brown rice.  1 cup mixed veggies.  1 tsp olive oil.  3 oz grilled fish. Document Released: 05/10/2005 Document Revised: 08/02/2011 Document Reviewed: 03/26/2011 [facility] Patient Information 2014 [place], [place].  Moles Moles are usually harmless growths on the skin. They are accumulations of color (pigment) cells in the skin that:   Can be various colors, from light brown to black.  Can appear anywhere on the body.  May remain flat or become raised.  May contain hairs.  May remain smooth or develop wrinkling. Most moles are not cancerous (benign). However, some moles may develop changes and become cancerous. It is important to check your moles every month. If you check your moles regularly, you will be able to notice any changes that may occur.  CAUSES  Moles occur when skin cells grow together in clusters instead of spreading out in the skin as they normally do. The reason for this clustering is unknown. DIAGNOSIS  Your caregiver will perform a skin examination to diagnose your mole.  TREATMENT  Moles usually do not require treatment. If a mole becomes worrisome, your caregiver may choose to take a sample of the mole or remove it entirely, and then send it to a lab for examination.  HOME CARE INSTRUCTIONS  Check your mole(s) monthly for changes that may indicate skin cancer. These changes can include:  A  change in size.  A change in color. Note that moles tend to darken during pregnancy or when taking birth control pills (oral contraception).  A change in shape.  A change in the border of the mole.  Wear sunscreen (with an SPF of at least 30) when you spend long periods of time outside. Reapply the sunscreen every 2 3 hours.  Schedule annual appointments with your skin doctor (dermatologist) if you have a large number of moles. SEEK MEDICAL CARE IF:  Your mole changes size, especially if it becomes larger than a pencil eraser.  Your mole changes in color or develops more than one color.  Your mole becomes itchy or bleeds.  Your mole, or the skin near the mole, becomes painful, sore, red, or swollen.  Your mole becomes scaly, sheds skin, or oozes fluid.  Your mole develops irregular borders.  Your mole becomes flat or develops raised areas.  Your mole becomes hard or soft. Document Released: 02/02/2001 Document Revised: 02/02/2012 Document Reviewed: 11/22/2011 [facility] Patient Information 2014 [place], [place].

## 2013-05-08 NOTE — Assessment & Plan Note (Signed)
Ongoing symptoms. Strongly recommend to avoid known triggers. PPIs on a empty stomach 30 minutes before breakfast daily Diet information provided

## 2013-05-09 ENCOUNTER — Encounter: Payer: Self-pay | Admitting: Internal Medicine

## 2013-05-15 ENCOUNTER — Telehealth: Payer: Self-pay | Admitting: Internal Medicine

## 2013-05-15 NOTE — Telephone Encounter (Signed)
Patient states that she spoke with her insurance company and was told that they needed something from Dr. Drue Novel stating that it was medically necessary for her to be on the Holter monitor in order for them to cover. Asked patient if she had a fax number or if her insurance had sent prior auth form. She did not have this information and stated that she would call us back with additional information.

## 2013-05-29 NOTE — Telephone Encounter (Addendum)
Will prepare a letter once she give [place] a fax number

## 2013-06-11 ENCOUNTER — Encounter: Payer: Self-pay | Admitting: *Deleted

## 2013-06-11 ENCOUNTER — Encounter (INDEPENDENT_AMBULATORY_CARE_PROVIDER_SITE_OTHER): Payer: Federal, State, Local not specified - PPO

## 2013-06-11 DIAGNOSIS — R002 Palpitations: Secondary | ICD-10-CM

## 2013-06-11 NOTE — Progress Notes (Signed)
Patient ID: Sally Sexton, female   DOB: March 23, 1991, 23 y.o.   MRN: [id number] E-Cardio 48 hour holter monitor applied to patient.

## 2013-08-10 ENCOUNTER — Ambulatory Visit (INDEPENDENT_AMBULATORY_CARE_PROVIDER_SITE_OTHER): Payer: Federal, State, Local not specified - PPO | Admitting: Family Medicine

## 2013-08-10 ENCOUNTER — Encounter: Payer: Self-pay | Admitting: Family Medicine

## 2013-08-10 VITALS — BP 110/70 | HR 90 | Temp 98.1°F | Wt 154.0 lb

## 2013-08-10 DIAGNOSIS — S139XXA Sprain of joints and ligaments of unspecified parts of neck, initial encounter: Secondary | ICD-10-CM

## 2013-08-10 DIAGNOSIS — J069 Acute upper respiratory infection, unspecified: Secondary | ICD-10-CM

## 2013-08-10 DIAGNOSIS — S161XXA Strain of muscle, fascia and tendon at neck level, initial encounter: Secondary | ICD-10-CM

## 2013-08-10 MED ORDER — LORATADINE 10 MG PO TABS: 10.0000 mg | ORAL_TABLET | Freq: Every day | ORAL | Status: DC

## 2013-08-10 MED ORDER — TRIAMCINOLONE ACETONIDE 55 MCG/ACT NA AERO: 2.0000 | INHALATION_SPRAY | Freq: Every day | NASAL | Status: DC

## 2013-08-10 MED ORDER — METHOCARBAMOL 500 MG PO TABS: 500.0000 mg | ORAL_TABLET | Freq: Four times a day (QID) | ORAL | Status: DC | PRN

## 2013-08-10 NOTE — Patient Instructions (Signed)

## 2013-08-10 NOTE — Progress Notes (Signed)
Pre visit review using our clinic review tool, if applicable. No additional management support is needed unless otherwise documented below in the visit note. 

## 2013-08-10 NOTE — Progress Notes (Signed)
  Subjective:     Sally Sexton is a 23 y.o. female who presents for evaluation of symptoms of a URI. Symptoms include bilateral ear pressure/pain, congestion, nasal congestion, post nasal drip and sinus pressure. Onset of symptoms was 2 days ago, and has been gradually worsening since that time. Treatment to date: antihistamines.  The following portions of the patient's history were reviewed and updated as appropriate: allergies, current medications, past family history, past medical history, past social history, past surgical history and problem list.  Review of Systems Pertinent items are noted in HPI.   Objective:    BP 110/70  Pulse 90  Temp(Src) 98.1 F (36.7 C) (Oral)  Wt 154 lb (69.854 kg)  SpO2 97% General appearance: alert, cooperative, appears stated age and no distress Ears: normal TM's and external ear canals both ears Nose: clear discharge, mild congestion, turbinates red, swollen, no sinus tenderness Throat: lips, mucosa, and tongue normal; teeth and gums normal Neck: no adenopathy, supple, symmetrical, trachea midline and thyroid not enlarged, symmetric, no tenderness/mass/nodules Lungs: clear to auscultation bilaterally Heart: S1, S2 normal   Assessment:    allergic rhinitis   Plan:    Suggested symptomatic OTC remedies. Nasal saline spray for congestion. Nasal steroids per orders. Follow up as needed.   1. Neck strain robaxin - Ambulatory referral to Chiropractic  2. Acute upper respiratory infections of unspecified site  - triamcinolone (NASACORT AQ) 55 MCG/ACT AERO nasal inhaler; Place 2 sprays into the nose daily.  Dispense: 1 Inhaler; Refill: 12 - loratadine (CLARITIN) 10 MG tablet; Take 1 tablet (10 mg total) by mouth daily.  Dispense: 30 tablet; Refill: 11

## 2013-09-07 ENCOUNTER — Telehealth: Payer: Self-pay

## 2013-09-10 ENCOUNTER — Encounter: Payer: Self-pay | Admitting: Internal Medicine

## 2013-09-10 ENCOUNTER — Ambulatory Visit (INDEPENDENT_AMBULATORY_CARE_PROVIDER_SITE_OTHER): Payer: Federal, State, Local not specified - PPO | Admitting: Internal Medicine

## 2013-09-10 VITALS — BP 100/64 | HR 90 | Temp 97.9°F | Ht 68.0 in | Wt 152.0 lb

## 2013-09-10 DIAGNOSIS — D229 Melanocytic nevi, unspecified: Secondary | ICD-10-CM

## 2013-09-10 DIAGNOSIS — Z23 Encounter for immunization: Secondary | ICD-10-CM

## 2013-09-10 DIAGNOSIS — R002 Palpitations: Secondary | ICD-10-CM

## 2013-09-10 DIAGNOSIS — Z0000 Encounter for general adult medical examination without abnormal findings: Secondary | ICD-10-CM

## 2013-09-10 NOTE — Patient Instructions (Signed)
Schedule labs, fasting FLP-- dx V70  Next visit is for a physical exam in 2 years  fasting Please make an appointment

## 2013-09-10 NOTE — Assessment & Plan Note (Signed)
She monitor her moles, has not been no change, specifically there is a mole at the right foot that has been unchanged for years

## 2013-09-10 NOTE — Progress Notes (Signed)
Pre visit review using our clinic review tool, if applicable. No additional management support is needed unless otherwise documented below in the visit note. 

## 2013-09-10 NOTE — Progress Notes (Signed)
   Subjective:    Patient ID: Sally Sexton, female    DOB: Sep 29, 1990, 23 y.o.   MRN: [id number]  DOS:  09/10/2013 Type of  visit: Complete physical exam  ROS Diet-- imrpoved Exercise-- going to the gym No  CP, SOB No palpitations, no lower extremity edema Denies  nausea, vomiting diarrhea Denies  blood in the stools  (-) cough, sputum production (-) wheezing, chest congestion No dysuria, gross hematuria, difficulty urinating  No anxiety, depression     Past Medical History  Diagnosis Date  . History of chicken pox     Past Surgical History  Procedure Laterality Date  . Wisdom tooth extraction  2009    History   Social History  . Marital Status: Single    Spouse Name: N/A    Number of Children: 0  . Years of Education: N/A   Occupational History  . [employer] , graduate [phone]   . [employer]    Social History Main Topics  . Smoking status: Never Smoker   . Smokeless tobacco: Never Used  . Alcohol Use: Yes     Comment: socially   . Drug Use: No  . Sexual Activity: Yes    Birth Control/ Protection: None   Other Topics Concern  . Not on file   Social History Narrative   Original from [place], lives w/ boyfriend    G1P0      Family History  Problem Relation Age of Onset  . Coronary artery disease Neg Hx   . Stroke Neg Hx   . Diabetes Neg Hx   . Colon cancer Neg Hx   . Breast cancer Neg Hx        Medication List       This list is accurate as of: 09/10/13  5:39 PM.  Always use your most recent med list.               omeprazole 40 MG capsule  Commonly known as:  PRILOSEC  TAKE ONE CAPSULE BY MOUTH DAILY           Objective:   Physical Exam BP 100/64  Pulse 90  Temp(Src) 97.9 F (36.6 C)  Ht 5\' 8"  (1.727 m)  Wt 152 lb (68.947 kg)  BMI 23.12 kg/m2  SpO2 100%  General -- alert, well-developed, NAD.  Neck --no thyromegaly   HEENT-- Not pale.   Heart-- normal rate, regular rhythm, no murmur.  Abdomen-- Not distended, good bowel  sounds,soft, non-tender. Extremities-- no pretibial edema bilaterally  Neurologic--  alert & oriented X3. Speech normal, gait normal, strength normal in all extremities.  Psych-- Cognition and judgment appear intact. Cooperative with normal attention span and concentration. No anxious or depressed appearing.     Assessment & Plan:

## 2013-09-10 NOTE — Assessment & Plan Note (Addendum)
TD ~ 2015  gynecology Dr Garwin Brothers  Doing great with diet and exercise, Has lost some weight. Will check an FLP otherwise return to the office in 2 years and as needed

## 2013-09-10 NOTE — Assessment & Plan Note (Signed)
Currently asymptomatic, Holter monitorFebruary 2015 negative. In retrospect, patient thinks she was very anxious

## 2013-09-10 NOTE — Telephone Encounter (Signed)
Unable to reach prior to visit  

## 2013-09-14 ENCOUNTER — Other Ambulatory Visit (INDEPENDENT_AMBULATORY_CARE_PROVIDER_SITE_OTHER): Payer: Federal, State, Local not specified - PPO

## 2013-09-14 ENCOUNTER — Other Ambulatory Visit: Payer: Federal, State, Local not specified - PPO

## 2013-09-14 DIAGNOSIS — Z0000 Encounter for general adult medical examination without abnormal findings: Secondary | ICD-10-CM

## 2013-09-14 LAB — LIPID PANEL
CHOL/HDL RATIO: 3
Cholesterol: 144 mg/dL (ref 0–200)
HDL: 53.5 mg/dL (ref >39.00)
LDL Cholesterol: 85 mg/dL (ref 0–99)
Triglycerides: 30 mg/dL (ref 0.0–149.0)
VLDL: 6 mg/dL (ref 0.0–40.0)

## 2013-11-27 LAB — OB RESULTS CONSOLE ABO/RH: RH Type: POSITIVE

## 2013-11-27 LAB — OB RESULTS CONSOLE HEPATITIS B SURFACE ANTIGEN: HEP B S AG: NEGATIVE

## 2013-11-27 LAB — OB RESULTS CONSOLE HIV ANTIBODY (ROUTINE TESTING): HIV: NONREACTIVE

## 2013-11-27 LAB — OB RESULTS CONSOLE RUBELLA ANTIBODY, IGM: Rubella: IMMUNE

## 2013-11-27 LAB — OB RESULTS CONSOLE RPR: RPR: NONREACTIVE

## 2014-03-16 IMAGING — CR DG ABDOMEN ACUTE W/ 1V CHEST
3 series · 3 of 3 positions shown · non-contrast
Comparison: None.

CLINICAL DATA: Abdominal pain

ACUTE ABDOMEN SERIES (ABDOMEN 2 VIEW & CHEST 1 VIEW)

[PA]
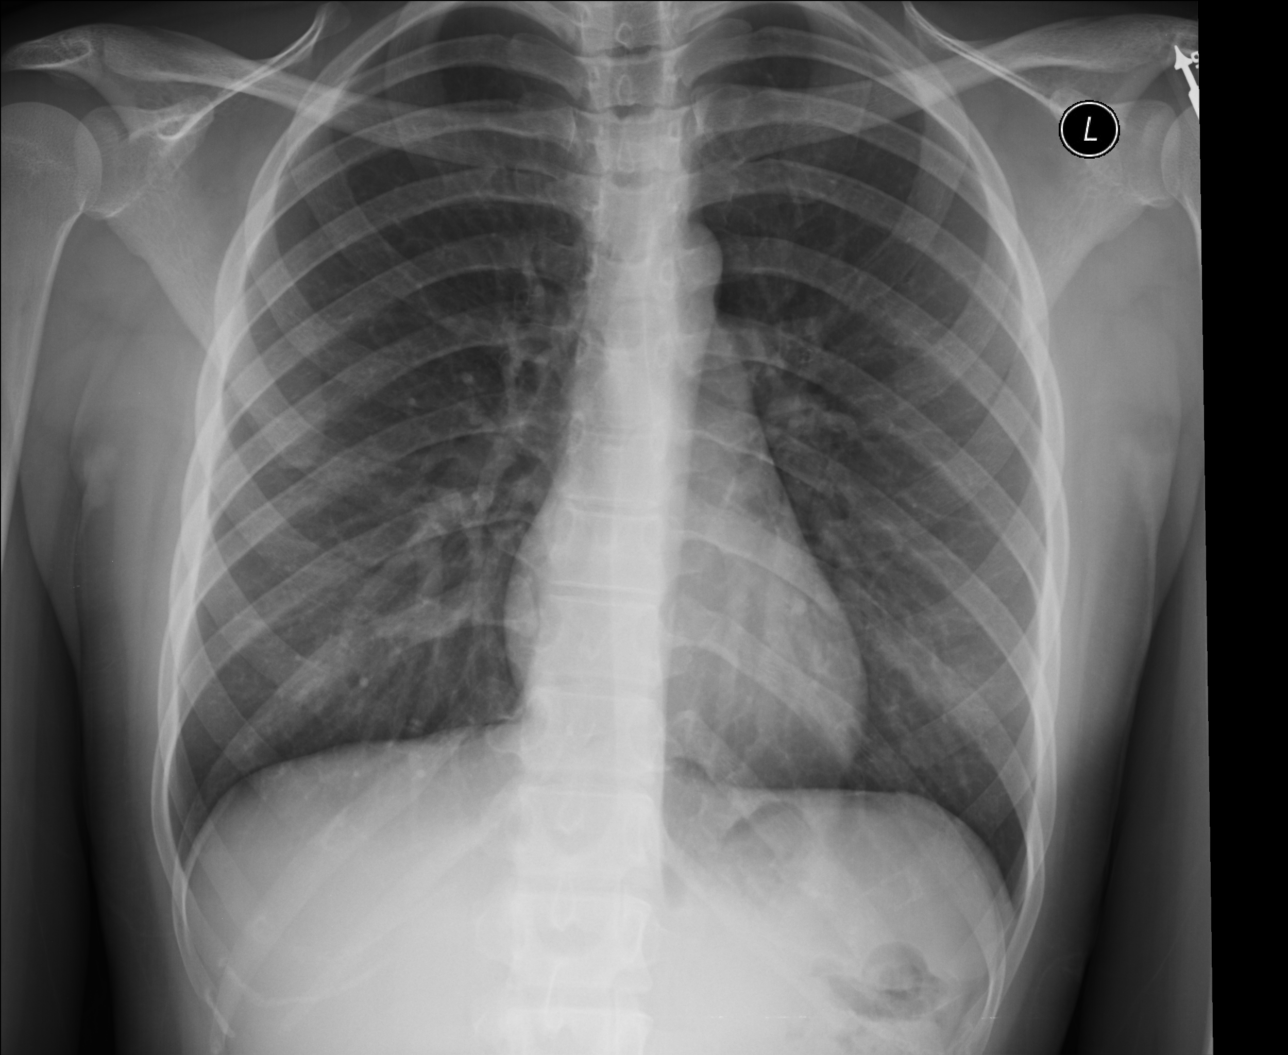

[AP (1 of 2)]
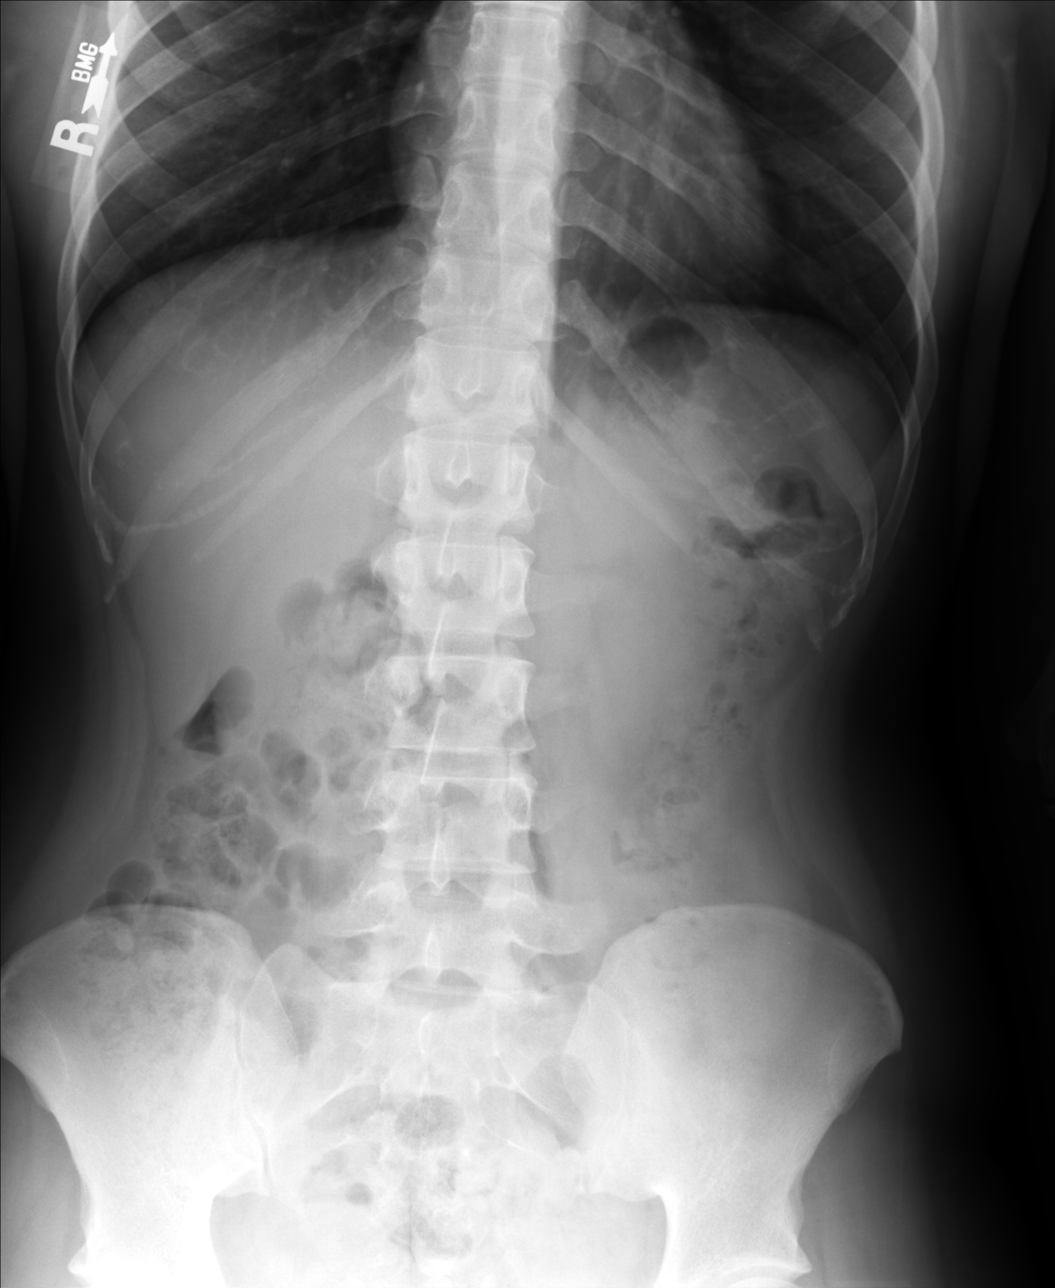

[AP (2 of 2)]
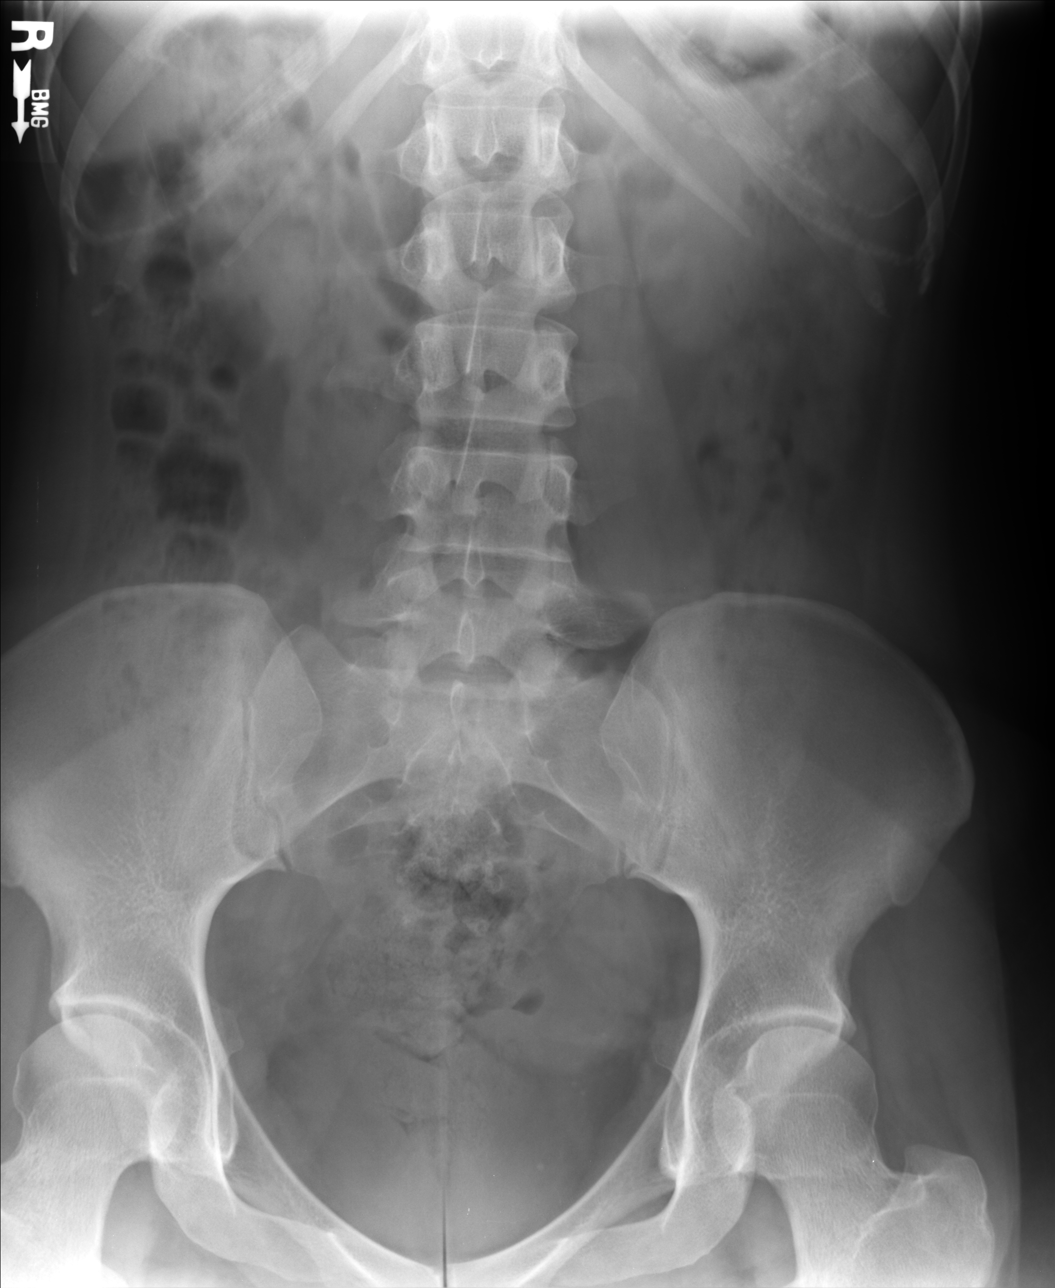

[3 of 3 positions shown; findings below may reference images not displayed]

FINDINGS: The lungs are clear.  Mediastinal contours appear normal.
The heart is within normal limits in size.  No bony abnormality is
seen.

Supine and erect views of the abdomen show only a moderate amount
of feces throughout the colon.  No bowel obstruction is seen.  No
free air is noted.
IMPRESSION: 1.  No active lung disease.
2.  Moderate amount of feces throughout the colon.  No bowel
obstruction.  No free air

Clinically significant discrepancy from primary report, if
provided: None

## 2014-05-24 NOTE — L&D Delivery Note (Signed)
Delivery Note  First Stage: Labor onset: 0500 Augmentation : AROM Analgesia /Anesthesia intrapartum: epidural AROM at 1236  Second Stage: Complete dilation at 1825 Onset of pushing at 1825 FHR second stage 135, mod variability, variables with pushing  Delivery of a viable female at 61 by CNM in OA position No nuchal cord Cord double clamped after cessation of pulsation, cut by FOB Cord blood sample collected   Collection of cord blood donation n/a Arterial cord blood sample n/a  Third Stage: Placenta delivered via Delena Bali intact with 3 VC @ 1919 Placenta disposition: routine disposal Uterine tone firm / bleeding small  Rt labial/periclitoral laceration and left labial laceration identified  Anesthesia for repair: regional Repaired with 4-0 Vicryl Est. Blood Loss (mL): 497  Complications: none  Mom to postpartum.  Baby to Couplet care / Skin to Skin.  Newborn: Birth Weight: pending  Apgar Scores: 9/9 Feeding planned: breast  Julianne Handler, N MSN, CNM 06/28/2014, 7:46 PM

## 2014-05-27 LAB — OB RESULTS CONSOLE GBS: STREP GROUP B AG: NEGATIVE

## 2014-06-28 ENCOUNTER — Inpatient Hospital Stay (HOSPITAL_COMMUNITY): Payer: Federal, State, Local not specified - PPO | Admitting: Anesthesiology

## 2014-06-28 ENCOUNTER — Inpatient Hospital Stay (HOSPITAL_COMMUNITY)
Admission: AD | Admit: 2014-06-28 | Discharge: 2014-06-30 | DRG: 775 | Disposition: A | Discharge status: Home / Self Care | Payer: Federal, State, Local not specified - PPO | Source: Ambulatory Visit | Attending: Obstetrics and Gynecology | Admitting: Obstetrics and Gynecology

## 2014-06-28 ENCOUNTER — Encounter (HOSPITAL_COMMUNITY): Payer: Self-pay | Admitting: *Deleted

## 2014-06-28 DIAGNOSIS — D6959 Other secondary thrombocytopenia: Secondary | ICD-10-CM | POA: Diagnosis present

## 2014-06-28 DIAGNOSIS — Z3A40 40 weeks gestation of pregnancy: Secondary | ICD-10-CM | POA: Diagnosis present

## 2014-06-28 DIAGNOSIS — O9912 Other diseases of the blood and blood-forming organs and certain disorders involving the immune mechanism complicating childbirth: Secondary | ICD-10-CM | POA: Diagnosis present

## 2014-06-28 DIAGNOSIS — IMO0001 Reserved for inherently not codable concepts without codable children: Secondary | ICD-10-CM

## 2014-06-28 HISTORY — DX: Other specified health status: Z78.9

## 2014-06-28 HISTORY — DX: Papillomavirus as the cause of diseases classified elsewhere: B97.7

## 2014-06-28 HISTORY — DX: Acute vaginitis: N76.0

## 2014-06-28 HISTORY — DX: Other specified bacterial agents as the cause of diseases classified elsewhere: B96.89

## 2014-06-28 LAB — TYPE AND SCREEN
ABO/RH(D): O POS
Antibody Screen: NEGATIVE

## 2014-06-28 LAB — OB RESULTS CONSOLE ANTIBODY SCREEN: ANTIBODY SCREEN: NEGATIVE

## 2014-06-28 LAB — CBC
HCT: 38.3 % (ref 36.0–46.0)
HEMOGLOBIN: 12.8 g/dL (ref 12.0–15.0)
MCH: 29.8 pg (ref 26.0–34.0)
MCHC: 33.4 g/dL (ref 30.0–36.0)
MCV: 89.1 fL (ref 78.0–100.0)
PLATELETS: 128 10*3/uL — AB (ref 150–400)
RBC: 4.3 MIL/uL (ref 3.87–5.11)
RDW: 12.8 % (ref 11.5–15.5)
WBC: 12.8 10*3/uL — AB (ref 4.0–10.5)

## 2014-06-28 LAB — OB RESULTS CONSOLE GC/CHLAMYDIA
CHLAMYDIA, DNA PROBE: NEGATIVE
GC PROBE AMP, GENITAL: NEGATIVE

## 2014-06-28 LAB — ABO/RH: ABO/RH(D): O POS

## 2014-06-28 MED ORDER — OXYCODONE-ACETAMINOPHEN 5-325 MG PO TABS: 2.0000 | ORAL_TABLET | ORAL | Status: DC | PRN

## 2014-06-28 MED ORDER — DIBUCAINE 1 % RE OINT: 1.0000 "application " | TOPICAL_OINTMENT | RECTAL | Status: DC | PRN

## 2014-06-28 MED ORDER — FENTANYL 2.5 MCG/ML BUPIVACAINE 1/10 % EPIDURAL INFUSION (WH - ANES)
INTRAMUSCULAR | Status: DC | PRN
  Administered 2014-06-28: 14 mL/h via EPIDURAL

## 2014-06-28 MED ORDER — FLEET ENEMA 7-19 GM/118ML RE ENEM: 1.0000 | ENEMA | RECTAL | Status: DC | PRN

## 2014-06-28 MED ORDER — LACTATED RINGERS IV SOLN
INTRAVENOUS | Status: DC
  Administered 2014-06-28 (×3): via INTRAVENOUS

## 2014-06-28 MED ORDER — LIDOCAINE HCL (PF) 1 % IJ SOLN
INTRAMUSCULAR | Status: DC | PRN
  Administered 2014-06-28 (×2): 8 mL

## 2014-06-28 MED ORDER — EPHEDRINE 5 MG/ML INJ
10.0000 mg | INTRAVENOUS | Status: DC | PRN
  Filled 2014-06-28: qty 2

## 2014-06-28 MED ORDER — IBUPROFEN 600 MG PO TABS
600.0000 mg | ORAL_TABLET | Freq: Four times a day (QID) | ORAL | Status: DC
  Administered 2014-06-28 – 2014-06-30 (×7): 600 mg via ORAL
  Filled 2014-06-28 (×7): qty 1

## 2014-06-28 MED ORDER — OXYCODONE-ACETAMINOPHEN 5-325 MG PO TABS: 1.0000 | ORAL_TABLET | ORAL | Status: DC | PRN

## 2014-06-28 MED ORDER — LANOLIN HYDROUS EX OINT: TOPICAL_OINTMENT | CUTANEOUS | Status: DC | PRN

## 2014-06-28 MED ORDER — LACTATED RINGERS IV SOLN: 500.0000 mL | INTRAVENOUS | Status: DC | PRN

## 2014-06-28 MED ORDER — LACTATED RINGERS IV SOLN: 500.0000 mL | Freq: Once | INTRAVENOUS | Status: DC

## 2014-06-28 MED ORDER — PHENYLEPHRINE 40 MCG/ML (10ML) SYRINGE FOR IV PUSH (FOR BLOOD PRESSURE SUPPORT)
80.0000 ug | PREFILLED_SYRINGE | INTRAVENOUS | Status: DC | PRN
  Filled 2014-06-28: qty 2
  Filled 2014-06-28: qty 20

## 2014-06-28 MED ORDER — ONDANSETRON HCL 4 MG/2ML IJ SOLN
4.0000 mg | Freq: Four times a day (QID) | INTRAMUSCULAR | Status: DC | PRN
  Administered 2014-06-28: 4 mg via INTRAVENOUS
  Filled 2014-06-28: qty 2

## 2014-06-28 MED ORDER — EPHEDRINE 5 MG/ML INJ
10.0000 mg | INTRAVENOUS | Status: DC | PRN
  Filled 2014-06-28: qty 4
  Filled 2014-06-28: qty 2

## 2014-06-28 MED ORDER — OXYTOCIN 40 UNITS IN LACTATED RINGERS INFUSION - SIMPLE MED
62.5000 mL/h | INTRAVENOUS | Status: DC
  Filled 2014-06-28: qty 1000

## 2014-06-28 MED ORDER — PRENATAL MULTIVITAMIN CH
1.0000 | ORAL_TABLET | Freq: Every day | ORAL | Status: DC
  Administered 2014-06-29 – 2014-06-30 (×2): 1 via ORAL
  Filled 2014-06-28 (×2): qty 1

## 2014-06-28 MED ORDER — ONDANSETRON HCL 4 MG PO TABS: 4.0000 mg | ORAL_TABLET | ORAL | Status: DC | PRN

## 2014-06-28 MED ORDER — DIPHENHYDRAMINE HCL 25 MG PO CAPS: 25.0000 mg | ORAL_CAPSULE | Freq: Four times a day (QID) | ORAL | Status: DC | PRN

## 2014-06-28 MED ORDER — CITRIC ACID-SODIUM CITRATE 334-500 MG/5ML PO SOLN: 30.0000 mL | ORAL | Status: DC | PRN

## 2014-06-28 MED ORDER — WITCH HAZEL-GLYCERIN EX PADS: 1.0000 "application " | MEDICATED_PAD | CUTANEOUS | Status: DC | PRN

## 2014-06-28 MED ORDER — LIDOCAINE HCL (PF) 1 % IJ SOLN
30.0000 mL | INTRAMUSCULAR | Status: DC | PRN
  Filled 2014-06-28: qty 30

## 2014-06-28 MED ORDER — FENTANYL 2.5 MCG/ML BUPIVACAINE 1/10 % EPIDURAL INFUSION (WH - ANES)
14.0000 mL/h | INTRAMUSCULAR | Status: DC | PRN
  Administered 2014-06-28 (×2): 14 mL/h via EPIDURAL
  Filled 2014-06-28 (×2): qty 125

## 2014-06-28 MED ORDER — DIPHENHYDRAMINE HCL 50 MG/ML IJ SOLN: 12.5000 mg | INTRAMUSCULAR | Status: DC | PRN

## 2014-06-28 MED ORDER — OXYTOCIN BOLUS FROM INFUSION
500.0000 mL | INTRAVENOUS | Status: DC
  Administered 2014-06-28: 500 mL via INTRAVENOUS

## 2014-06-28 MED ORDER — ONDANSETRON HCL 4 MG/2ML IJ SOLN: 4.0000 mg | INTRAMUSCULAR | Status: DC | PRN

## 2014-06-28 MED ORDER — ACETAMINOPHEN 325 MG PO TABS: 650.0000 mg | ORAL_TABLET | ORAL | Status: DC | PRN

## 2014-06-28 MED ORDER — SIMETHICONE 80 MG PO CHEW: 80.0000 mg | CHEWABLE_TABLET | ORAL | Status: DC | PRN

## 2014-06-28 MED ORDER — SENNOSIDES-DOCUSATE SODIUM 8.6-50 MG PO TABS
2.0000 | ORAL_TABLET | ORAL | Status: DC
  Administered 2014-06-28 – 2014-06-30 (×2): 2 via ORAL
  Filled 2014-06-28 (×2): qty 2

## 2014-06-28 MED ORDER — PHENYLEPHRINE 40 MCG/ML (10ML) SYRINGE FOR IV PUSH (FOR BLOOD PRESSURE SUPPORT)
80.0000 ug | PREFILLED_SYRINGE | INTRAVENOUS | Status: DC | PRN
  Filled 2014-06-28: qty 2

## 2014-06-28 MED ORDER — BENZOCAINE-MENTHOL 20-0.5 % EX AERO: 1.0000 "application " | INHALATION_SPRAY | CUTANEOUS | Status: DC | PRN

## 2014-06-28 NOTE — MAU Note (Signed)
C/o ucs since 0515 this AM;

## 2014-06-28 NOTE — Anesthesia Procedure Notes (Signed)
Epidural Patient location during procedure: OB Start time: 06/28/2014 10:18 AM End time: 06/28/2014 10:22 AM  Staffing Anesthesiologist: Lyn Hollingshead Performed by: anesthesiologist   Preanesthetic Checklist Completed: patient identified, surgical consent, pre-op evaluation, timeout performed, IV checked, risks and benefits discussed and monitors and equipment checked  Epidural Patient position: sitting Prep: site prepped and draped and DuraPrep Patient monitoring: continuous pulse ox and blood pressure Approach: midline Location: L3-L4 Injection technique: LOR air  Needle:  Needle type: Tuohy  Needle gauge: 17 G Needle length: 9 cm and 9 Needle insertion depth: 5 cm cm Catheter type: closed end flexible Catheter size: 19 Gauge Catheter at skin depth: 10 cm Test dose: negative and Other  Assessment Sensory level: T9 Events: blood not aspirated, injection not painful, no injection resistance, negative IV test and no paresthesia  Additional Notes Reason for block:procedure for pain

## 2014-06-28 NOTE — MAU Note (Signed)
Crying and out of control; thrashing around in bed and clinging to FOB; difficult to maintain fetal tracing due to pt's movement;

## 2014-06-28 NOTE — Progress Notes (Signed)
Subjective:   Comfortable with epidural, no c/o.  Objective:   VS: Blood pressure 120/68, pulse 89, temperature 98.2 F (36.8 C), temperature source Axillary, resp. rate 18, height 5\' 8"  (1.727 m), weight 83.462 kg (184 lb), SpO2 100 %. FHR: baseline 135 / variability mod / accelerations + / no decelerations Toco: contractions every 2-4 minutes Cervix: Dilation: 4 Effacement (%): 80 Station: -1 Exam by:: Etheleen Sia, CNM Membranes: AROM-clear, abundant  Assessment:  Labor: first stage-active, protracted  FHR category I  Plan:  AROM for augmentation, anticipate progression and SVD. Peanut ball and left sims.     Julianne Handler, N MSN, CNM 06/28/2014, 12:45 PM

## 2014-06-28 NOTE — Lactation Note (Signed)
This note was copied from the chart of Sally Sexton. Lactation Consultation Note  Patient Name: Sally Sexton [id number] Date: 06/28/2014 Reason for consult: Initial assessment of this mom and baby soon after arrival on pp unit, 3 hours after delivery.  RN, Sally Sexton reports assisting mom with hand expression and noting copious colostrum but mom c/o breast and nipple tenderness, both with hand expression and latch.  Baby received 7 ml's of formula by bottle at mom's request prior to first breastfeed.  LATCH score=7 at subsequent breastfeed but only 5 minutes.  It is mom's intention to both breast and formula feed so LC reviewed LEAD cautions and  Possible consequences of early introduction of formula while attempting to establish breastfeeding and milk supply.  LC encouraged cue feedings and frequent STS. Mom encouraged to feed baby 8-12 times/24 hours and with feeding cues. LC encouraged review of Baby and Me pp 9, 14 and 20-25 for STS and BF information. LC provided [employer] brochure and reviewed [facility] services and list of community and web site resources.     Maternal Data Formula Feeding for Exclusion: Yes Reason for exclusion: Mother's choice to formula and breast feed on admission Has patient been taught Hand Expression?: Yes (RN, Sally Sexton demonstrated hand expression; reports copious colostrum but mom sensitive) Does the patient have breastfeeding experience prior to this delivery?: No  Feeding Feeding Type: Breast Fed Length of feed: 5 min  LATCH Score/Interventions Latch: Grasps breast easily, tongue down, lips flanged, rhythmical sucking.  Audible Swallowing: A few with stimulation Intervention(s): Skin to skin  Type of Nipple: Everted at rest and after stimulation  Comfort (Breast/Nipple): Soft / non-tender     Hold (Positioning): Full assist, staff holds infant at breast Intervention(s): Breastfeeding basics reviewed  LATCH Score: 7 (initial LATCH score, per RN  assessment)  Lactation Tools Discussed/Used   STS, cue feedings, hand expression  Consult Status Consult Status: Follow-up Date: 06/29/14 Follow-up type: In-patient    Junious Dresser [facility] 06/28/2014, 10:41 PM

## 2014-06-28 NOTE — H&P (Signed)
OB ADMISSION/ HISTORY & PHYSICAL:  Admission Date: 06/28/2014  7:59 AM  Admit Diagnosis: 40.[redacted] weeks gestation, active labor  Sally Sexton is a 24 y.o. female presenting for regular painful ctx since 0500.  Prenatal History: G2P0010   EDC: 06/28/2014, by 6 wk sono   Prenatal care at [facility]  Primary Ob Provider: Dr. Garwin Brothers Prenatal course complicated by gestational thrombocytopenia w/last Plt ct 153, isolated EIF on anatomy sono-offered genetic screen, declined.  Prenatal Labs: ABO, Rh: O (07/07 0000) POS Antibody: Negative (02/05 0000) Rubella: Immune (07/07 0000)  RPR: Nonreactive (07/07 0000)  HBsAg: Negative (07/07 0000)  HIV: Non-reactive (07/07 0000)  GBS: Negative (01/04 0000)  1 hr GTT: 85  Medical / Surgical History :  Past medical history:  Past Medical History  Diagnosis Date  . History of chicken pox   . Medical history non-contributory      Past surgical history:  Past Surgical History  Procedure Laterality Date  . Wisdom tooth extraction  2009    Family History:  Family History  Problem Relation Age of Onset  . Coronary artery disease Neg Hx   . Stroke Neg Hx   . Diabetes Neg Hx   . Colon cancer Neg Hx   . Breast cancer Neg Hx      Social History:  reports that she has never smoked. She has never used smokeless tobacco. She reports that she drinks alcohol. She reports that she does not use illicit drugs.  Allergies: Review of patient's allergies indicates no known allergies.   Current Medications at time of admission:  Prior to Admission medications   Medication Sig Start Date End Date Taking? Authorizing Provider  Prenatal Vit-Fe Fumarate-FA (PRENATAL MULTIVITAMIN) TABS tablet Take 1 tablet by mouth daily at 12 noon.   Yes Historical Provider, MD  omeprazole (PRILOSEC) 40 MG capsule TAKE ONE CAPSULE BY MOUTH DAILY Patient not taking: Reported on 06/28/2014 05/08/13   Colon Branch, MD     Review of Systems: +FM +ctx q 3  min No LOF No VB  Physical Exam:  VS: Blood pressure 116/73, pulse 93, temperature 97.4 F (36.3 C), resp. rate 20, height 5\' 8"  (1.727 m), weight 83.462 kg (184 lb).  General: alert and oriented, appears very uncomfortable w/ctx Heart: RRR Lungs: Clear lung fields Abdomen: Gravid, soft and non-tender, non-distended / uterus: gravid, non-tender Extremities: No edema Genitalia / VE: Dilation: 4 Effacement (%): 80 Station: -3 Exam by:: [employer], cnm; EFW: 7 lbs FHR: baseline rate 130 / variability mod / accelerations present / no decelerations TOCO: 2-3, moderate  Assessment: [redacted] weeks gestation First stage of labor, early active FHR category I GBS negative  Plan:  Admit, check platelets, epidural per request, continuous EFM, anticipate normal labor progression and SVD. Dr. Ronita Hipps notified of admission / plan of care   Graciela Husbands MSN, CNM 06/28/2014, 9:40 AM

## 2014-06-28 NOTE — Anesthesia Preprocedure Evaluation (Signed)
Anesthesia Evaluation    Reviewed: Allergy & Precautions, H&P , NPO status , Patient's Chart, lab work & pertinent test results  Airway Mallampati: I  TM Distance: >3 FB Neck ROM: full    Dental  (+) Teeth Intact   Pulmonary neg pulmonary ROS,    Pulmonary exam normal       Cardiovascular negative cardio ROS      Neuro/Psych negative neurological ROS  negative psych ROS   GI/Hepatic negative GI ROS, Neg liver ROS,   Endo/Other  negative endocrine ROS  Renal/GU negative Renal ROS     Musculoskeletal   Abdominal Normal abdominal exam  (+)   Peds  Hematology negative hematology ROS (+)   Anesthesia Other Findings   Reproductive/Obstetrics (+) Pregnancy                             Anesthesia Physical Anesthesia Plan  ASA: II  Anesthesia Plan: Epidural   Post-op Pain Management:    Induction:   Airway Management Planned:   Additional Equipment:   Intra-op Plan:   Post-operative Plan:   Informed Consent: I have reviewed the patients History and Physical, chart, labs and discussed the procedure including the risks, benefits and alternatives for the proposed anesthesia with the patient or authorized representative who has indicated his/her understanding and acceptance.     Plan Discussed with:   Anesthesia Plan Comments:         Anesthesia Quick Evaluation

## 2014-06-28 NOTE — Progress Notes (Addendum)
Subjective:   Comfortable with epidural, legs feel numb but still mobile.  Objective:   VS: Blood pressure 124/61, pulse 80, temperature 97 F (36.1 C), temperature source Oral, resp. rate 18, height 5\' 8"  (1.727 m), weight 83.462 kg (184 lb), SpO2 100 %. FHR: baseline 125 / variability mod / accelerations present / no decelerations Toco: contractions every 2-3 minutes Cervix: Dilation: 9 Effacement (%): 100 Station: 0 Exam by:: bambri Membranes: clear, +bloody show  Assessment:  Labor: first stage, active FHR category I  Plan:  High fowlers, recheck in 1-2 hrs, start pushing if complete and lower station, anticipate SVD. Dr. Pamala Hurry updated with A/P.    Julianne Handler, N MSN, CNM 06/28/2014, 4:57 PM

## 2014-06-29 ENCOUNTER — Encounter (HOSPITAL_COMMUNITY): Payer: Self-pay | Admitting: *Deleted

## 2014-06-29 LAB — RPR: RPR Ser Ql: NONREACTIVE

## 2014-06-29 NOTE — Anesthesia Postprocedure Evaluation (Signed)
Anesthesia Post Note  Patient: Sally Sexton  Procedure(s) Performed: * No procedures listed *  Anesthesia type: Epidural  Patient location: Mother/Baby  Post pain: Pain level controlled  Post assessment: Post-op Vital signs reviewed  Last Vitals:  Filed Vitals:   06/29/14 0542  BP: 120/58  Pulse: 85  Temp: 36.4 C  Resp: 18    Post vital signs: Reviewed  Level of consciousness:alert  Complications: No apparent anesthesia complications

## 2014-06-29 NOTE — Lactation Note (Signed)
This note was copied from the chart of Sally Sexton. Lactation Consultation Note  Patient Name: Sally Sexton [id number] Date: 06/29/2014 Reason for consult: Follow-up assessment;Breast/nipple pain Mom has company and reports baby had bottle at 1645. Mom reports she wants to keep trying to BF. [place] left phone number for Mom to call with next feeding for assist. Advised not to give formula before the feeding so baby will work with [place] at the breast.   Maternal Data    Feeding Feeding Type: Formula  LATCH Score/Interventions                      Lactation Tools Discussed/Used     Consult Status Consult Status: Follow-up Date: 06/29/14 Follow-up type: In-patient    Katrine Coho 06/29/2014, 5:48 PM

## 2014-06-29 NOTE — Progress Notes (Signed)
Patient ID: Sally Sexton, female   DOB: [birth year], 24 y.o.   MRN: [id number] PPD # 1 SVD  S:  Reports feeling well             Tolerating po/ No nausea or vomiting             Bleeding is light             Pain controlled with ibuprofen (OTC)             Up ad lib / ambulatory / voiding without difficulties    Newborn  Information for the patient's newborn:  [name], [name] [[id number]]  female  breast and bottle (formula) feeding    O:  A & O x 3, in no apparent distress              VS:  Filed Vitals:   06/28/14 2100 06/28/14 2200 06/29/14 0247 06/29/14 0542  BP: 131/77 119/62 114/67 120/58  Pulse: 77 89 96 85  Temp:  97.6 F (36.4 C) 98.2 F (36.8 C) 97.5 F (36.4 C)  TempSrc:  Oral Oral Oral  Resp: 18 18 18 18   Height:      Weight:      SpO2:        LABS:  Recent Labs  06/28/14 0945  WBC 12.8*  HGB 12.8  HCT 38.3  PLT 128*    Blood type: O POS (02/05 1215)  Rubella: Immune (07/07 0000)   I&O: I/O last 3 completed shifts: In: -  Out: 800 [Urine:500; Blood:300]             Lungs: Clear and unlabored  Heart: regular rate and rhythm / no murmurs  Abdomen: soft, non-tender, non-distended              Fundus: firm, non-tender, U-1  Perineum: labial repairs healing well, mild edema  Lochia: minimal   Extremities: no edema, no calf pain or tenderness, no Homans    A/P: PPD # 1  24 y.o., [id number]   Principal Problem:    Postpartum care following vaginal delivery (2/5)  Active Problems:    Active labor   Doing well - stable status  Routine post partum orders  Anticipate discharge tomorrow    Laury Deep, M, MSN, CNM 06/29/2014, 9:55 AM

## 2014-06-30 LAB — CBC
HCT: 37.9 % (ref 36.0–46.0)
HEMOGLOBIN: 12.5 g/dL (ref 12.0–15.0)
MCH: 30.1 pg (ref 26.0–34.0)
MCHC: 33 g/dL (ref 30.0–36.0)
MCV: 91.3 fL (ref 78.0–100.0)
Platelets: 150 10*3/uL (ref 150–400)
RBC: 4.15 MIL/uL (ref 3.87–5.11)
RDW: 13.1 % (ref 11.5–15.5)
WBC: 12.9 10*3/uL — AB (ref 4.0–10.5)

## 2014-06-30 MED ORDER — POLYSACCHARIDE IRON COMPLEX 150 MG PO CAPS: 150.0000 mg | ORAL_CAPSULE | Freq: Every day | ORAL | Status: AC

## 2014-06-30 NOTE — Lactation Note (Signed)
This note was copied from the chart of Sally Tinika Bucknam. Lactation Consultation Note  Patient Name: Sally Sexton Date: 06/30/2014 Reason for consult: Follow-up assessment;Other (Comment) (per mom undecided whether to switch to bottle )  Baby is 40 hours and has been receiving bottles mostly .  Mom still mentioning she may want to breast feed . LC discussed supply and demand and the importance of the 1st 2 weeks  Of breast feeding and establishing milk supply . Per mom doesn't have breast pump at home except the manual . LC recommended rental / $ 40.00 insurance pump. Kit provided and mom instructed on set up. LC also recommended giving the baby opportunity to latch on both breast and supplement if needed . Due to the baby receiving so  Many bottles if the baby won't latch to to give an appetizer of EBN or formula with a bottle and then latch.  Also to add 5-6 post pumps until milk comes in 10 -15 mins , and give milk back to baby. Sore nipple and engorgement prevention and tx  Reviewed - referring to the baby and me booklet pages 24 -43.  Mother informed of post-discharge support and given phone number to the lactation department, including services for phone call assistance; out-patient appointments; and breastfeeding support group. List of other breastfeeding resources in the community given in the handout. Encouraged mother to call for problems or concerns related to breastfeeding.    Maternal Data    Feeding    LATCH Score/Interventions                Intervention(s): Breastfeeding basics reviewed (see LC note )     Lactation Tools Discussed/Used WIC Program: No (per mom ) Pump Review: Setup, frequency, and cleaning;Milk Storage Initiated by:: MAI  Date initiated:: 06/30/14   Consult Status Consult Status: Follow-up Date: 06/30/14 Follow-up type: In-patient    Sally Sexton 06/30/2014, 11:33 AM

## 2014-06-30 NOTE — Progress Notes (Signed)
Clinical Social Work Department PSYCHOSOCIAL ASSESSMENT - MATERNAL/CHILD 06/30/2014  Patient:  Sally Sexton,Sally Sexton  Account Number:  [id number]  Admit Date:  06/28/2014  Childs Name:   Sally Sexton    Clinical Social Worker:  Keajah Killough, LCSW   Date/Time:  06/30/2014 09:00 AM  Date Referred:  06/29/2014   Referral source  [facility]     Referred reason  Anxiety   Other referral source:    I:  FAMILY / HOME ENVIRONMENT Child's legal guardian:  PARENT  Guardian - Name Guardian - Age Guardian - Address  Sally Sexton [address]  [place], [place] [address]  Sally Sexton 26 Same as above   Other household support members/support persons Other support:    II  PSYCHOSOCIAL DATA Information Source:    Financial and Community Resources Employment:   Both parent are employed   Financial resources:  Private Insurance If Medicaid - County:    School / Grade:   Maternity Care Coordinator / Child Services Coordination / Early Interventions:  Cultural issues impacting care:    III  STRENGTHS Strengths  Supportive family/friends  Home prepared for Child (including basic supplies)  Adequate Resources   Strength comment:    IV  RISK FACTORS AND CURRENT PROBLEMS Current Problem:     Risk Factor & Current Problem Patient Issue Family Issue Risk Factor / Current Problem Comment   N N     V  SOCIAL WORK ASSESSMENT Acknowledged order for social work consult to assess mother's hx of anxiety.   Met with mother who was pleasant and receptive to social work intervention.   She and FOB are not married, but reside together.   This is the mother's first child.    Mother acknowledges hx of anxiety.  Informed that she participated in therapy in 2013, and it was beneficial.  She denies any hx ofmanaging her anxiety with medication.    She denies any hx of illicit drug use.   No acute social concerns noted or reported at this time.  Mother informed of social work  availability.     VI SOCIAL WORK PLAN Social Work Plan  No Further Intervention Required / No Barriers to Discharge   Type of pt/family education:   PP Depression signs/symptoms,  and resources    

## 2014-06-30 NOTE — Discharge Instructions (Signed)
Breast Pumping Tips °If you are breastfeeding, there may be times when you cannot feed your baby directly. Returning to work or going on a trip are common examples. Pumping allows you to store breast milk and feed it to your baby later.  °You may not get much milk when you first start to pump. Your breasts should start to make more after a few days. If you pump at the times you usually feed your baby, you may be able to keep making enough milk to feed your baby without also using formula. The more often you pump, the more milk you will produce.  °WHEN SHOULD I PUMP?  °· You can begin to pump soon after delivery. However, some experts recommend waiting about 4 weeks before giving your infant a bottle to make sure breastfeeding is going well.  °· If you plan to return to work, begin pumping a few weeks before. This will help you develop techniques that work best for you. It also lets you build up a supply of breast milk.   °· When you are with your infant, feed on demand and pump after each feeding.   °· When you are away from your infant for several hours, pump for about 15 minutes every 2-3 hours. Pump both breasts at the same time if you can.   °· If your infant has a formula feeding, make sure to pump around the same time.     °· If you drink any alcohol, wait 2 hours before pumping.   °HOW DO I PREPARE TO PUMP? °Your let-down reflex is the natural reaction to stimulation that makes your breast milk flow. It is easier to stimulate this reflex when you are relaxed. Find relaxation techniques that work for you. If you have difficulty with your let-down reflex, try these methods:  °· Smell one of your infant's blankets or an item of clothing.   °· Look at a picture or video of your infant.   °· Sit in a quiet, private space.   °· Massage the breast you plan to pump.   °· Place soothing warmth on the breast.   °· Play relaxing music.   °WHAT ARE SOME GENERAL BREAST PUMPING TIPS? °· Wash your hands before you pump. You  do not need to wash your nipples or breasts. °· There are three ways to pump. °¨ You can use your hand to massage and compress your breast. °¨ You can use a handheld manual pump. °¨ You can use an electric pump.   °· Make sure the suction cup (flange) on the breast pump is the right size. Place the flange directly over the nipple. If it is the wrong size or placed the wrong way, it may be painful and cause nipple damage.   °· If pumping is uncomfortable, apply a small amount of purified or modified lanolin to your nipple and areola. °· If you are using an electric pump, adjust the speed and suction power to be more comfortable. °· If pumping is painful or if you are not getting very much milk, you may need a different type of pump. A lactation consultant can help you determine what type of pump to use.   °· Keep a full water bottle near you at all times. Drinking lots of fluid helps you make more milk.  °· You can store your milk to use later. Pumped breast milk can be stored in a sealable, sterile container or plastic bag. Label all stored breast milk with the date you pumped it. °¨ Milk can stay out at room temperature for up to 8 hours. °¨   You can store your milk in the refrigerator for up to 8 days.  You can store your milk in the freezer for 3 months. Thaw frozen milk using warm water. Do not put it in the microwave.  Do not smoke. Smoking can lower your milk supply and harm your infant. If you need help quitting, ask your health care provider to recommend a program.  [place] A LACTATION CONSULTANT?  You are having trouble pumping.  You are concerned that you are not making enough milk.  You have nipple pain, soreness, or redness.  You want to use birth control. Birth control pills may lower your milk supply. Talk to your health care provider about your options. Document Released: 10/28/2009 Document Revised: 05/15/2013 Document Reviewed:  03/02/2013 [facility] Patient Information 2015 [place], [place]. This information is not intended to replace advice given to you by your health care provider. Make sure you discuss any questions you have with your health care provider.  Breastfeeding Deciding to breastfeed is one of the best choices you can make for you and your baby. A change in hormones during pregnancy causes your breast tissue to grow and increases the number and size of your milk ducts. These hormones also allow proteins, sugars, and fats from your blood supply to make breast milk in your milk-producing glands. Hormones prevent breast milk from being released before your baby is born as well as prompt milk flow after birth. Once breastfeeding has begun, thoughts of your baby, as well as his or her sucking or crying, can stimulate the release of milk from your milk-producing glands.  BENEFITS OF BREASTFEEDING For Your Baby  Your first milk (colostrum) helps your baby's digestive system function better.   There are antibodies in your milk that help your baby fight off infections.   Your baby has a lower incidence of asthma, allergies, and sudden infant death syndrome.   The nutrients in breast milk are better for your baby than infant formulas and are designed uniquely for your baby's needs.   Breast milk improves your baby's brain development.   Your baby is less likely to develop other conditions, such as childhood obesity, asthma, or type 2 diabetes mellitus.  For You   Breastfeeding helps to create a very special bond between you and your baby.   Breastfeeding is convenient. Breast milk is always available at the correct temperature and costs nothing.   Breastfeeding helps to burn calories and helps you lose the weight gained during pregnancy.   Breastfeeding makes your uterus contract to its prepregnancy size faster and slows bleeding (lochia) after you give birth.   Breastfeeding helps to lower your risk  of developing type 2 diabetes mellitus, osteoporosis, and breast or ovarian cancer later in life. SIGNS THAT YOUR BABY IS HUNGRY Early Signs of Hunger  Increased alertness or activity.  Stretching.  Movement of the head from side to side.  Movement of the head and opening of the mouth when the corner of the mouth or cheek is stroked (rooting).  Increased sucking sounds, smacking lips, cooing, sighing, or squeaking.  Hand-to-mouth movements.  Increased sucking of fingers or hands. Late Signs of Hunger  Fussing.  Intermittent crying. Extreme Signs of Hunger Signs of extreme hunger will require calming and consoling before your baby will be able to breastfeed successfully. Do not wait for the following signs of extreme hunger to occur before you initiate breastfeeding:   Restlessness.  A loud, strong  cry.   Screaming. BREASTFEEDING BASICS Breastfeeding Initiation  Find a comfortable place to sit or lie down, with your neck and back well supported.  Place a pillow or rolled up blanket under your baby to bring him or her to the level of your breast (if you are seated). Nursing pillows are specially designed to help support your arms and your baby while you breastfeed.  Make sure that your baby's abdomen is facing your abdomen.   Gently massage your breast. With your fingertips, massage from your chest wall toward your nipple in a circular motion. This encourages milk flow. You may need to continue this action during the feeding if your milk flows slowly.  Support your breast with 4 fingers underneath and your thumb above your nipple. Make sure your fingers are well away from your nipple and your baby's mouth.   Stroke your baby's lips gently with your finger or nipple.   When your baby's mouth is open wide enough, quickly bring your baby to your breast, placing your entire nipple and as much of the colored area around your nipple (areola) as possible into your baby's  mouth.   More areola should be visible above your baby's upper lip than below the lower lip.   Your baby's tongue should be between his or her lower gum and your breast.   Ensure that your baby's mouth is correctly positioned around your nipple (latched). Your baby's lips should create a seal on your breast and be turned out (everted).  It is common for your baby to suck about 2-3 minutes in order to start the flow of breast milk. Latching Teaching your baby how to latch on to your breast properly is very important. An improper latch can cause nipple pain and decreased milk supply for you and poor weight gain in your baby. Also, if your baby is not latched onto your nipple properly, he or she may swallow some air during feeding. This can make your baby fussy. Burping your baby when you switch breasts during the feeding can help to get rid of the air. However, teaching your baby to latch on properly is still the best way to prevent fussiness from swallowing air while breastfeeding. Signs that your baby has successfully latched on to your nipple:    Silent tugging or silent sucking, without causing you pain.   Swallowing heard between every 3-4 sucks.    Muscle movement above and in front of his or her ears while sucking.  Signs that your baby has not successfully latched on to nipple:   Sucking sounds or smacking sounds from your baby while breastfeeding.  Nipple pain. If you think your baby has not latched on correctly, slip your finger into the corner of your baby's mouth to break the suction and place it between your baby's gums. Attempt breastfeeding initiation again. Signs of Successful Breastfeeding Signs from your baby:   A gradual decrease in the number of sucks or complete cessation of sucking.   Falling asleep.   Relaxation of his or her body.   Retention of a small amount of milk in his or her mouth.   Letting go of your breast by himself or herself. Signs  from you:  Breasts that have increased in firmness, weight, and size 1-3 hours after feeding.   Breasts that are softer immediately after breastfeeding.  Increased milk volume, as well as a change in milk consistency and color by the fifth day of breastfeeding.   Nipples that  are not sore, cracked, or bleeding. Signs That Your [employer]el Books is Getting Enough Milk  Wetting at least 3 diapers in a 24-hour period. The urine should be clear and pale yellow by age 80 days.  At least 3 stools in a 24-hour period by age 80 days. The stool should be soft and yellow.  At least 3 stools in a 24-hour period by age 88 days. The stool should be seedy and yellow.  No loss of weight greater than 10% of birth weight during the first 49 days of age.  Average weight gain of 4-7 ounces (113-198 g) per week after age 59 days.  Consistent daily weight gain by age 62 days, without weight loss after the age of 2 weeks. After a feeding, your baby may spit up a small amount. This is common. BREASTFEEDING FREQUENCY AND DURATION Frequent feeding will help you make more milk and can prevent sore nipples and breast engorgement. Breastfeed when you feel the need to reduce the fullness of your breasts or when your baby shows signs of hunger. This is called "breastfeeding on demand." Avoid introducing a pacifier to your baby while you are working to establish breastfeeding (the first 4-6 weeks after your baby is born). After this time you may choose to use a pacifier. Research has shown that pacifier use during the first year of a baby's life decreases the risk of sudden infant death syndrome (SIDS). Allow your baby to feed on each breast as long as he or she wants. Breastfeed until your baby is finished feeding. When your baby unlatches or falls asleep while feeding from the first breast, offer the second breast. Because newborns are often sleepy in the first few weeks of life, you may need to awaken your baby to get him or her to  feed. Breastfeeding times will vary from baby to baby. However, the following rules can serve as a guide to help you ensure that your baby is properly fed:  Newborns (babies 16 weeks of age or younger) may breastfeed every 1-3 hours.  Newborns should not go longer than 3 hours during the day or 5 hours during the night without breastfeeding.  You should breastfeed your baby a minimum of 8 times in a 24-hour period until you begin to introduce solid foods to your baby at around 64 months of age. BREAST MILK PUMPING Pumping and storing breast milk allows you to ensure that your baby is exclusively fed your breast milk, even at times when you are unable to breastfeed. This is especially important if you are going back to work while you are still breastfeeding or when you are not able to be present during feedings. Your lactation consultant can give you guidelines on how long it is safe to store breast milk.  A breast pump is a machine that allows you to pump milk from your breast into a sterile bottle. The pumped breast milk can then be stored in a refrigerator or freezer. Some breast pumps are operated by hand, while others use electricity. Ask your lactation consultant which type will work best for you. Breast pumps can be purchased, but some hospitals and breastfeeding support groups lease breast pumps on a monthly basis. A lactation consultant can teach you how to hand express breast milk, if you prefer not to use a pump.  CARING FOR YOUR BREASTS WHILE YOU BREASTFEED Nipples can become dry, cracked, and sore while breastfeeding. The following recommendations can help keep your breasts moisturized and healthy:  Avoid  using soap on your nipples.   Wear a supportive bra. Although not required, special nursing bras and tank tops are designed to allow access to your breasts for breastfeeding without taking off your entire bra or top. Avoid wearing underwire-style bras or extremely tight bras.  Air dry  your nipples for 3-60minutes after each feeding.   Use only cotton bra pads to absorb leaked breast milk. Leaking of breast milk between feedings is normal.   Use lanolin on your nipples after breastfeeding. Lanolin helps to maintain your skin's normal moisture barrier. If you use pure lanolin, you do not need to wash it off before feeding your baby again. Pure lanolin is not toxic to your baby. You may also hand express a few drops of breast milk and gently massage that milk into your nipples and allow the milk to air dry. In the first few weeks after giving birth, some women experience extremely full breasts (engorgement). Engorgement can make your breasts feel heavy, warm, and tender to the touch. Engorgement peaks within 3-5 days after you give birth. The following recommendations can help ease engorgement:  Completely empty your breasts while breastfeeding or pumping. You may want to start by applying warm, moist heat (in the shower or with warm water-soaked hand towels) just before feeding or pumping. This increases circulation and helps the milk flow. If your baby does not completely empty your breasts while breastfeeding, pump any extra milk after he or she is finished.  Wear a snug bra (nursing or regular) or tank top for 1-2 days to signal your body to slightly decrease milk production.  Apply ice packs to your breasts, unless this is too uncomfortable for you.  Make sure that your baby is latched on and positioned properly while breastfeeding. If engorgement persists after 48 hours of following these recommendations, contact your health care provider or a Science writer. OVERALL HEALTH CARE RECOMMENDATIONS WHILE BREASTFEEDING  Eat healthy foods. Alternate between meals and snacks, eating 3 of each per day. Because what you eat affects your breast milk, some of the foods may make your baby more irritable than usual. Avoid eating these foods if you are sure that they are negatively  affecting your baby.  Drink milk, fruit juice, and water to satisfy your thirst (about 10 glasses a day).   Rest often, relax, and continue to take your prenatal vitamins to prevent fatigue, stress, and anemia.  Continue breast self-awareness checks.  Avoid chewing and smoking tobacco.  Avoid alcohol and drug use. Some medicines that may be harmful to your baby can pass through breast milk. It is important to ask your health care provider before taking any medicine, including all over-the-counter and prescription medicine as well as vitamin and herbal supplements. It is possible to become pregnant while breastfeeding. If birth control is desired, ask your health care provider about options that will be safe for your baby. SEEK MEDICAL CARE IF:   You feel like you want to stop breastfeeding or have become frustrated with breastfeeding.  You have painful breasts or nipples.  Your nipples are cracked or bleeding.  Your breasts are red, tender, or warm.  You have a swollen area on either breast.  You have a fever or chills.  You have nausea or vomiting.  You have drainage other than breast milk from your nipples.  Your breasts do not become full before feedings by the fifth day after you give birth.  You feel sad and depressed.  Your baby is  too sleepy to eat well.  Your baby is having trouble sleeping.   Your baby is wetting less than 3 diapers in a 24-hour period.  Your baby has less than 3 stools in a 24-hour period.  Your baby's skin or the white part of his or her eyes becomes yellow.   Your baby is not gaining weight by 64 days of age. SEEK IMMEDIATE MEDICAL CARE IF:   Your baby is overly tired (lethargic) and does not want to wake up and feed.  Your baby develops an unexplained fever. Document Released: 05/10/2005 Document Revised: 05/15/2013 Document Reviewed: 11/01/2012 [facility] Patient Information 2015 [place], [place]. This information is not intended to  replace advice given to you by your health care provider. Make sure you discuss any questions you have with your health care provider.  Nutrition for the New Mother  A new mother needs good health and nutrition so she can have energy to take care of a new baby. Whether a mother breastfeeds or formula feeds the baby, it is important to have a well-balanced diet. Foods from all the food groups should be chosen to meet the new mother's energy needs and to give her the nutrients needed for repair and healing.  A HEALTHY EATING PLAN The My Pyramid plan for Moms outlines what you should eat to help you and your baby stay healthy. The energy and amount of food you need depends on whether or not you are breastfeeding. If you are breastfeeding you will need more nutrients. If you choose not to breastfeed, your nutrition goal should be to return to a healthy weight. Limiting calories may be needed if you are not breastfeeding.  HOME CARE INSTRUCTIONS  For a personal plan based on your unique needs, see your Registered Dietitian or visit [email or web address] Eat a variety of foods. The plan below will help guide you. The following chart has a suggested daily meal plan from the My Pyramid for Moms. Eat a variety of fruits and vegetables. Eat more dark green and orange vegetables and cooked dried beans. Make half your grains whole grains. Choose whole instead of refined grains. Choose low-fat or lean meats and poultry. Choose low-fat or fat-free dairy products like milk, cheese, or yogurt. Fruits Breastfeeding: 2 cups Non-Breastfeeding: 2 cups What Counts as a serving? 1 cup of fruit or juice.  cup dried fruit. Vegetables Breastfeeding: 3 cups Non-Breastfeeding: 2  cups What Counts as a serving? 1 cup raw or cooked vegetables. Juice or 2 cups raw leafy vegetables. Grains Breastfeeding: 8 oz Non-Breastfeeding: 6 oz What Counts as a serving? 1 slice bread. 1 oz ready-to-eat cereal.  cup cooked  pasta, rice, or cereal. Meat and Beans Breastfeeding: 6  oz Non-Breastfeeding: 5  oz What Counts as a serving? 1 oz lean meat, poultry, or fish  cup cooked dry beans  oz nuts or 1 egg 1 tbs peanut butter Milk Breastfeeding: 3 cups Non-Breastfeeding: 3 cups What Counts as a serving? 1 cup milk. 8 oz yogurt. 1  oz cheese. 2 oz processed cheese. TIPS FOR THE BREASTFEEDING MOM Rapid weight loss is not suggested when you are breastfeeding. By simply breastfeeding, you will be able to lose the weight gained during your pregnancy. Your caregiver can keep track of your weight and tell you if your weight loss is appropriate. Be sure to drink fluids. You may notice that you are thirstier than usual. A suggestion is to drink a glass of water or other beverage whenever you breastfeed. Avoid alcohol  as it can be passed into your breast milk. Limit caffeine drinks to no more than 2 to 3 cups per day. You may need to keep taking your prenatal vitamin while you are breastfeeding. Talk with your caregiver about taking a vitamin or supplement. RETURING TO A HEALTHY WEIGHT The My Pyramid Plan for Moms will help you return to a healthy weight. It will also provide the nutrients you need. You may need to limit "empty" calories. These include: High fat foods like fried foods, fatty meats, fast food, butter, and mayonnaise. High sugar foods like sodas, jelly, candy, and sweets. Be physically active. Include 30 minutes of exercise or more each day. Choose an activity you like such as walking, swimming, biking, or aerobics. Check with your caregiver before you start to exercise. Document Released: 08/17/2007 Document Revised: 08/02/2011 Document Reviewed: 08/17/2007 [facility] Patient Information 2015 [place], [place]. This information is not intended to replace advice given to you by your health care provider. Make sure you discuss any questions you have with your health care provider. Postpartum Depression  and Baby Blues The postpartum period begins right after the birth of a baby. During this time, there is often a great amount of joy and excitement. It is also a time of many changes in the life of the parents. Regardless of how many times a mother gives birth, each child brings new challenges and dynamics to the family. It is not unusual to have feelings of excitement along with confusing shifts in moods, emotions, and thoughts. All mothers are at risk of developing postpartum depression or the "baby blues." These mood changes can occur right after giving birth, or they may occur many months after giving birth. The baby blues or postpartum depression can be mild or severe. Additionally, postpartum depression can go away rather quickly, or it can be a long-term condition.  CAUSES Raised hormone levels and the rapid drop in those levels are thought to be a main cause of postpartum depression and the baby blues. A number of hormones change during and after pregnancy. Estrogen and progesterone usually decrease right after the delivery of your baby. The levels of thyroid hormone and various cortisol steroids also rapidly drop. Other factors that play a role in these mood changes include major life events and genetics.  RISK FACTORS If you have any of the following risks for the baby blues or postpartum depression, know what symptoms to watch out for during the postpartum period. Risk factors that may increase the likelihood of getting the baby blues or postpartum depression include: Having a personal or family history of depression.  Having depression while being pregnant.  Having premenstrual mood issues or mood issues related to oral contraceptives. Having a lot of life stress.  Having marital conflict.  Lacking a social support network.  Having a baby with special needs.  Having health problems, such as diabetes.  SIGNS AND SYMPTOMS Symptoms of baby blues include: Brief changes in mood, such as  going from extreme happiness to sadness. Decreased concentration.  Difficulty sleeping.  Crying spells, tearfulness.  Irritability.  Anxiety.  Symptoms of postpartum depression typically begin within the first month after giving birth. These symptoms include: Difficulty sleeping or excessive sleepiness.  Marked weight loss.  Agitation.  Feelings of worthlessness.  Lack of interest in activity or food.  Postpartum psychosis is a very serious condition and can be dangerous. Fortunately, it is rare. Displaying any of the following symptoms is cause for immediate medical attention. Symptoms of postpartum  psychosis include:  Hallucinations and delusions.  Bizarre or disorganized behavior.  Confusion or disorientation.  DIAGNOSIS  A diagnosis is made by an evaluation of your symptoms. There are no medical or lab tests that lead to a diagnosis, but there are various questionnaires that a health care provider may use to identify those with the baby blues, postpartum depression, or psychosis. Often, a screening tool called the [place] Postnatal Depression Scale is used to diagnose depression in the postpartum period.  TREATMENT The baby blues usually goes away on its own in 1-2 weeks. Social support is often all that is needed. You will be encouraged to get adequate sleep and rest. Occasionally, you may be given medicines to help you sleep.  Postpartum depression requires treatment because it can last several months or longer if it is not treated. Treatment may include individual or group therapy, medicine, or both to address any social, physiological, and psychological factors that may play a role in the depression. Regular exercise, a healthy diet, rest, and social support may also be strongly recommended.  Postpartum psychosis is more serious and needs treatment right away. Hospitalization is often needed. HOME CARE INSTRUCTIONS Get as much rest as you can. Nap when the baby sleeps.   Exercise regularly. Some women find yoga and walking to be beneficial.  Eat a balanced and nourishing diet.  Do little things that you enjoy. Have a cup of tea, take a bubble bath, read your favorite magazine, or listen to your favorite music. Avoid alcohol.  Ask for help with household chores, cooking, grocery shopping, or running errands as needed. Do not try to do everything.  Talk to people close to you about how you are feeling. Get support from your partner, family members, friends, or other new moms. Try to stay positive in how you think. Think about the things you are grateful for.  Do not spend a lot of time alone.  Only take over-the-counter or prescription medicine as directed by your health care provider. Keep all your postpartum appointments.  Let your health care provider know if you have any concerns.  SEEK MEDICAL CARE IF: You are having a reaction to or problems with your medicine. SEEK IMMEDIATE MEDICAL CARE IF: You have suicidal feelings.  You think you may harm the baby or someone else. MAKE SURE YOU: Understand these instructions. Will watch your condition. Will get help right away if you are not doing well or get worse. Document Released: 02/12/2004 Document Revised: 05/15/2013 Document Reviewed: 02/19/2013 [facility] Patient Information 2015 [place], [place]. This information is not intended to replace advice given to you by your health care provider. Make sure you discuss any questions you have with your health care provider. Breastfeeding and Mastitis Mastitis is inflammation of the breast tissue. It can occur in women who are breastfeeding. This can make breastfeeding painful. Mastitis will sometimes go away on its own. Your health care provider will help determine if treatment is needed. CAUSES Mastitis is often associated with a blocked milk (lactiferous) duct. This can happen when too much milk builds up in the breast. Causes of excess milk in the breast  can include: Poor latch-on. If your baby is not latched onto the breast properly, she or he may not empty your breast completely while breastfeeding. Allowing too much time to pass between feedings. Wearing a bra or other clothing that is too tight. This puts extra pressure on the lactiferous ducts so milk does not flow through them as it should. Mastitis  can also be caused by a bacterial infection. Bacteria may enter the breast tissue through cuts or openings in the skin. In women who are breastfeeding, this may occur because of cracked or irritated skin. Cracks in the skin are often caused when your baby does not latch on properly to the breast. SIGNS AND SYMPTOMS Swelling, redness, tenderness, and pain in an area of the breast. Swelling of the glands under the arm on the same side. Fever may or may not accompany mastitis. If an infection is allowed to progress, a collection of pus (abscess) may develop. DIAGNOSIS  Your health care provider can usually diagnose mastitis based on your symptoms and a physical exam. Tests may be done to help confirm the diagnosis. These may include: Removal of pus from the breast by applying pressure to the area. This pus can be examined in the lab to determine which bacteria are present. If an abscess has developed, the fluid in the abscess can be removed with a needle. This can also be used to confirm the diagnosis and determine the bacteria present. In most cases, pus will not be present. Blood tests to determine if your body is fighting a bacterial infection. Mammogram or ultrasound tests to rule out other problems or diseases. TREATMENT  Mastitis that occurs with breastfeeding will sometimes go away on its own. Your health care provider may choose to wait 24 hours after first seeing you to decide whether a prescription medicine is needed. If your symptoms are worse after 24 hours, your health care provider will likely prescribe an antibiotic medicine to treat the  mastitis. He or she will determine which bacteria are most likely causing the infection and will then select an appropriate antibiotic medicine. This is sometimes changed based on the results of tests performed to identify the bacteria, or if there is no response to the antibiotic medicine selected. Antibiotic medicines are usually given by mouth. You may also be given medicine for pain. HOME CARE INSTRUCTIONS Only take over-the-counter or prescription medicines for pain, fever, or discomfort as directed by your health care provider. If your health care provider prescribed an antibiotic medicine, take the medicine as directed. Make sure you finish it even if you start to feel better. Do not wear a tight or underwire bra. Wear a soft, supportive bra. Increase your fluid intake, especially if you have a fever. Continue to empty the breast. Your health care provider can tell you whether this milk is safe for your infant or needs to be thrown out. You may be told to stop nursing until your health care provider thinks it is safe for your baby. Use a breast pump if you are advised to stop nursing. Keep your nipples clean and dry. Empty the first breast completely before going to the other breast. If your baby is not emptying your breasts completely for some reason, use a breast pump to empty your breasts. If you go back to work, pump your breasts while at work to stay in time with your nursing schedule. Avoid allowing your breasts to become overly filled with milk (engorged). SEEK MEDICAL CARE IF: You have pus-like discharge from the breast. Your symptoms do not improve with the treatment prescribed by your health care provider within 2 days. SEEK IMMEDIATE MEDICAL CARE IF: Your pain and swelling are getting worse. You have pain that is not controlled with medicine. You have a red line extending from the breast toward your armpit. You have a fever or persistent symptoms for  more than 2-3 days. You have  a fever and your symptoms suddenly get worse. MAKE SURE YOU:  Understand these instructions. Will watch your condition. Will get help right away if you are not doing well or get worse. Document Released: 09/04/2004 Document Revised: 05/15/2013 Document Reviewed: 12/14/2012 [facility] Patient Information 2015 [place], [place]. This information is not intended to replace advice given to you by your health care provider. Make sure you discuss any questions you have with your health care provider.

## 2014-06-30 NOTE — Discharge Summary (Signed)
Obstetric Discharge Summary  Reason for Admission: Pt is a G2P1011 at [redacted]w[redacted]d admitted in active labor.  Patient has received care at [facility] since 9.4 wks, with Dr. Garwin Brothers as primary provider.  Medications on Admission: Prescriptions prior to admission  Medication Sig Dispense Refill Last Dose  . Prenatal Vit-Fe Fumarate-FA (PRENATAL MULTIVITAMIN) TABS tablet Take 1 tablet by mouth daily at 12 noon.   06/27/2014 at Unknown time  . omeprazole (PRILOSEC) 40 MG capsule TAKE ONE CAPSULE BY MOUTH DAILY (Patient not taking: Reported on 06/28/2014) 90 capsule 0 Taking    Prenatal Labs: ABO, Rh: O POS (02/05 1215)  Antibody: NEG (02/05 1215) Rubella: Immune (07/07 0000) RPR: Non Reactive (02/05 0945)  HBsAg: Negative (07/07 0000)  HIV: Non-reactive (07/07 0000)  GTT : Normal - 85 GBS: Negative (01/04 0000)   Prenatal Procedures: ultrasound Intrapartum Course: Admitted in active labor at term / gestational thrombocytopenia stable / epidural for pain management / normal progression to complete dilation / SVD of viable female with bilateral labial and periclitoral repairs by Julianne Handler, CNM / no immediate postpartum complications noted Intrapartum Procedures: spontaneous vaginal delivery Postpartum Procedures: none Complications-Operative and Postpartum: bilateral labial and periclitoral lacerations  Labs: HEMOGLOBIN  Date Value Ref Range Status  06/28/2014 12.8 12.0 - 15.0 g/dL Final  10/24/2012 12.9 12.2 - 16.2 g/dL Final   HCT  Date Value Ref Range Status  06/28/2014 38.3 36.0 - 46.0 % Final   HCT, POC  Date Value Ref Range Status  10/24/2012 41.6 37.7 - 47.9 % Final   Lab Results  Component Value Date   PLT 128* 06/28/2014    Newborn Data: Live born female  Birth Weight: 8 lb 3.7 oz (3734 g) APGAR: 9, 9  Home with mother.   Discharge Information: Date: 06/30/2014 Discharge Diagnoses:  Pt is a G2P1011 at [redacted]w[redacted]d S/P Term Pregnancy-delivered on  06/28/2014  Condition: stable Activity: pelvic rest Diet: routine Medications:    Medication List    ASK your doctor about these medications        omeprazole 40 MG capsule  Commonly known as:  PRILOSEC  TAKE ONE CAPSULE BY MOUTH DAILY     prenatal multivitamin Tabs tablet  Take 1 tablet by mouth daily at 12 noon.       Instructions: The [facility] OB/GYN instruction booklet has been given and reviewed Discharge to: home     Follow-up Information    Follow up with COUSINS,SHERONETTE A, MD. Schedule an appointment as soon as possible for a visit in 6 weeks.   Specialty:  Obstetrics and Gynecology   Why:  postpartum visit - notify staff of desire for IUD when scheduleing appointment   Contact information:   [address] [place] [address] [phone]       Laury Deep, Jerilynn Mages, MSN, CNM 06/30/2014, 10:45 AM

## 2014-06-30 NOTE — Progress Notes (Addendum)
Patient ID: Sally Sexton, female   DOB: 03/21/1991, 24 y.o.   MRN: [id number] Post Partum Day #2            Information for the patient's newborn:  Tammela, Bales [[id number]]  female  Feeding: breast  Subjective: No HA, SOB, CP, F/C, breast symptoms. Pain well-controlled with ibuprofen. Normal vaginal bleeding, no clots.      Objective:  Temp:  [97.8 F (36.6 C)-98.3 F (36.8 C)] 98.3 F (36.8 C) (02/07 0600) Pulse Rate:  [75-79] 75 (02/07 0600) Resp:  [18-20] 18 (02/07 0600) BP: (96-106)/(62-66) 106/66 mmHg (02/07 0600)    Recent Labs  06/28/14 0945  WBC 12.8*  HGB 12.8  HCT 38.3  PLT 128*    Blood type:O POS (02/05 1215) Rubella: Immune (07/07 0000)    Physical Exam:  General: alert, cooperative and no distress Uterine Fundus: firm, midline, U-2 Lochia: appropriate Perineum: bilateral labial and periclitoral repair healing well, edema none DVT Evaluation: No evidence of DVT seen on physical exam. Negative Homan's sign. No cords or calf tenderness. No significant calf/ankle edema.    Assessment/Plan: PPD # 2 / 24 y.o., G2P1011 S/P: spontaneous vaginal  Principal Problem:    Postpartum care following vaginal delivery (2/5)  Active Problems:    Gestational Thrombocytopenia - stable     Normal postpartum exam  Continue current postpartum care  D/C home   LOS: 2 days   Renato Battles, Comfort Iversen, M, MSN, CNM 06/30/2014, 10:15 AM

## 2014-07-02 ENCOUNTER — Inpatient Hospital Stay (HOSPITAL_COMMUNITY): Admission: RE | Admit: 2014-07-02 | Payer: Federal, State, Local not specified - PPO | Source: Ambulatory Visit

## 2016-07-11 ENCOUNTER — Emergency Department (HOSPITAL_COMMUNITY)
Admission: EM | Admit: 2016-07-11 | Discharge: 2016-07-11 | Disposition: A | Discharge status: Home / Self Care | Payer: Managed Care, Other (non HMO) | Attending: Emergency Medicine | Admitting: Emergency Medicine

## 2016-07-11 ENCOUNTER — Encounter (HOSPITAL_COMMUNITY): Payer: Self-pay | Admitting: Emergency Medicine

## 2016-07-11 DIAGNOSIS — Z79899 Other long term (current) drug therapy: Secondary | ICD-10-CM | POA: Diagnosis not present

## 2016-07-11 DIAGNOSIS — R0789 Other chest pain: Secondary | ICD-10-CM | POA: Diagnosis present

## 2016-07-11 DIAGNOSIS — K219 Gastro-esophageal reflux disease without esophagitis: Secondary | ICD-10-CM | POA: Insufficient documentation

## 2016-07-11 MED ORDER — PANTOPRAZOLE SODIUM 20 MG PO TBEC: 20.0000 mg | DELAYED_RELEASE_TABLET | Freq: Every day | ORAL | 0 refills | Status: AC

## 2016-07-11 MED ORDER — GI COCKTAIL ~~LOC~~
30.0000 mL | Freq: Once | ORAL | Status: AC
  Administered 2016-07-11: 30 mL via ORAL
  Filled 2016-07-11: qty 30

## 2016-07-11 NOTE — ED Triage Notes (Signed)
Pt c/o central chest pain that is worse when she presses down on her chest; pt states she's been burping more often and is more "gassy" recently; pt took Tums with relief; pt states she took Mylanta an hour ago and had burning in her chest afterwards

## 2016-07-11 NOTE — ED Notes (Signed)
Patient using restroom, delay in EKG.

## 2016-07-11 NOTE — ED Provider Notes (Signed)
[facility] DEPT Provider Note   CSN: [id number] Arrival date & time: 07/11/16  2119    By signing my name below, I, Macon Large, attest that this documentation has been prepared under the direction and in the presence of Shary Decamp PA-C. Electronically Signed: Macon Large, ED Scribe. 07/11/16. 10:05 PM.  History   Chief Complaint Chief Complaint  Patient presents with  . Chest Pain  . Gastroesophageal Reflux   The history is provided by the patient. No language interpreter was used.   HPI Comments: Sally Sexton is a 26 y.o. female who presents to the Emergency Department complaining of moderate, intermittent, chest pain onset last week. Pt notes she suddenly began to experience chest pressure three days ago with PO ingestion. She reports taking Tums with significant relief. Pt states she suddenly began to have chest pain a day later with a pressure-like sensation.She states it assisted in relieving gas. She notes her chest pain is exacerbated with eating. She denies cough, congestion, fever.   Past Medical History:  Diagnosis Date  . BV (bacterial vaginosis)   . History of chicken pox   . HPV (human papilloma virus) infection   . Medical history non-contributory     Patient Active Problem List   Diagnosis Date Noted  . Active labor 06/28/2014  . Postpartum care following vaginal delivery (2/5) 06/28/2014  . Numerous moles 05/08/2013  . History of anxiety 02/07/2013  . Annual physical exam 03/08/2011  . Palpitations 03/08/2011  . GERD (gastroesophageal reflux disease) 03/08/2011    Past Surgical History:  Procedure Laterality Date  . WISDOM TOOTH EXTRACTION  2009    OB History    Gravida Para Term Preterm AB Living   2 1 1  0 1 1   SAB TAB Ectopic Multiple Live Births   0 1 0 0 1       Home Medications    Prior to Admission medications   Medication Sig Start Date End Date Taking? Authorizing Provider  iron polysaccharides (NIFEREX) 150 MG  capsule Take 1 capsule (150 mg total) by mouth daily. 06/30/14   Laury Deep, CNM  omeprazole (PRILOSEC) 40 MG capsule TAKE ONE CAPSULE BY MOUTH DAILY Patient not taking: Reported on 06/28/2014 05/08/13   Colon Branch, MD  Prenatal Vit-Fe Fumarate-FA (PRENATAL MULTIVITAMIN) TABS tablet Take 1 tablet by mouth daily at 12 noon.    Historical Provider, MD    Family History Family History  Problem Relation Age of Onset  . Coronary artery disease Neg Hx   . Stroke Neg Hx   . Diabetes Neg Hx   . Colon cancer Neg Hx   . Breast cancer Neg Hx     Social History Social History  Substance Use Topics  . Smoking status: Never Smoker  . Smokeless tobacco: Never Used  . Alcohol use Yes     Comment: socially      Allergies   Patient has no known allergies.   Review of Systems Review of Systems  Constitutional: Negative for fever.  HENT: Negative for congestion.   Respiratory: Negative for cough.   Cardiovascular: Positive for chest pain.  Genitourinary: Positive for flank pain (left-sided).   Physical Exam Updated Vital Signs BP 137/81   Pulse 81   Temp 98.2 F (36.8 C) (Oral)   Resp 18   LMP 07/11/2016 (Exact Date)   SpO2 100%   Breastfeeding? No   Physical Exam  Constitutional: She is oriented to person, place, and time. She appears well-developed and  well-nourished. No distress.  HENT:  Head: Normocephalic and atraumatic.  Right Ear: Tympanic membrane, external ear and ear canal normal.  Left Ear: Tympanic membrane, external ear and ear canal normal.  Nose: Nose normal.  Mouth/Throat: Uvula is midline, oropharynx is clear and moist and mucous membranes are normal. No trismus in the jaw. No oropharyngeal exudate, posterior oropharyngeal erythema or tonsillar abscesses.  Eyes: Conjunctivae and EOM are normal. Pupils are equal, round, and reactive to light. Right eye exhibits no discharge. Left eye exhibits no discharge.  Neck: Normal range of motion. Neck supple. No tracheal  deviation present.  Cardiovascular: Normal rate, regular rhythm, S1 normal, S2 normal, normal heart sounds, intact distal pulses and normal pulses.   Pulmonary/Chest: Effort normal and breath sounds normal. No respiratory distress. She has no decreased breath sounds. She has no wheezes. She has no rhonchi. She has no rales.  Abdominal: Normal appearance and bowel sounds are normal. There is tenderness in the epigastric area.  Musculoskeletal: Normal range of motion.  Neurological: She is alert and oriented to person, place, and time. Coordination normal.  Skin: Skin is warm and dry. No rash noted. She is not diaphoretic. No erythema.  Psychiatric: She has a normal mood and affect. Her speech is normal and behavior is normal. Thought content normal.  Nursing note and vitals reviewed.    ED Treatments / Results   DIAGNOSTIC STUDIES: Oxygen Saturation is 100% on RA, normal by my interpretation.    COORDINATION OF CARE: 10:02 PM Discussed treatment plan with pt at bedside which includes GI cocktail and pt agreed to plan.   Labs (all labs ordered are listed, but only abnormal results are displayed) Labs Reviewed - No data to display  EKG  EKG Interpretation None       Radiology No results found.  Procedures Procedures (including critical care time)  Medications Ordered in ED Medications  gi cocktail (Maalox,Lidocaine,Donnatal) (not administered)     Initial Impression / Assessment and Plan / ED Course  I have reviewed the triage vital signs and the nursing notes.  Pertinent labs & imaging results that were available during my care of the patient were reviewed by me and considered in my medical decision making (see chart for details).   Final Clinical Impressions(s) / ED Diagnoses  I have reviewed the relevant previous healthcare records. I obtained HPI from historian.  ED Course:  Assessment: Pt is a 25yF presents with chest pressure this AM as well as several days  ago s/p ingestion of food. Noted taking Antacids with significant relief. Risk Factors for ACS: None. Given GI cocktail with significant relief in ED. Patient is to be discharged with recommendation to follow up with PCP in regards to today's hospital visit. Chest pain is not likely of cardiac or pulmonary etiology d/t presentation, perc negative, VSS, no tracheal deviation, no JVD or new murmur, RRR, breath sounds equal bilaterally.Pt has been advised start a PPI and return to the ED is CP becomes exertional, associated with diaphoresis or nausea, radiates to left jaw/arm, worsens or becomes concerning in any way. Pt appears reliable for follow up and is agreeable to discharge. Patient is in no acute distress. Vital Signs are stable. Patient is able to ambulate. Patient able to tolerate PO.   Disposition/Plan:  DC Home Additional Verbal discharge instructions given and discussed with patient.  Pt Instructed to f/u with PCP in the next week for evaluation and treatment of symptoms. Return precautions given Pt acknowledges and agrees  with plan  Supervising Physician Lacretia Leigh, MD  Final diagnoses:  Gastroesophageal reflux disease, esophagitis presence not specified    New Prescriptions New Prescriptions   No medications on file   I personally performed the services described in this documentation, which was scribed in my presence. The recorded information has been reviewed and is accurate.    Shary Decamp, PA-C 07/11/16 2249    Lacretia Leigh, MD 07/12/16 0001

## 2016-07-11 NOTE — Discharge Instructions (Signed)
Please read and follow all provided instructions.  Your diagnoses today include:  1. Gastroesophageal reflux disease, esophagitis presence not specified     Tests performed today include: Vital signs. See below for your results today.   Medications prescribed:   Take any prescribed medications only as directed.  Follow-up instructions: Please follow-up with your primary care provider as soon as you can for further evaluation of your symptoms.   Return instructions:  SEEK IMMEDIATE MEDICAL ATTENTION IF: You have severe chest pain, especially if the pain is crushing or pressure-like and spreads to the arms, back, neck, or jaw, or if you have sweating, nausea (feeling sick to your stomach), or shortness of breath. THIS IS AN EMERGENCY. Don't wait to see if the pain will go away. Get medical help at once. Call 911 or 0 (operator). DO NOT drive yourself to the hospital.  Your chest pain gets worse and does not go away with rest.  You have an attack of chest pain lasting longer than usual, despite rest and treatment with the medications your caregiver has prescribed.  You wake from sleep with chest pain or shortness of breath. You feel dizzy or faint. You have chest pain not typical of your usual pain for which you originally saw your caregiver.  You have any other emergent concerns regarding your health.  Additional Information: Chest pain comes from many different causes. Your caregiver has diagnosed you as having chest pain that is not specific for one problem, but does not require admission.  You are at low risk for an acute heart condition or other serious illness.   Your vital signs today were: BP 137/81    Pulse 81    Temp 98.2 F (36.8 C) (Oral)    Resp 18    LMP 07/11/2016 (Exact Date)    SpO2 100%    Breastfeeding? No  If your blood pressure (BP) was elevated above 135/85 this visit, please have this repeated by your doctor within one month. --------------

## 2019-07-27 ENCOUNTER — Encounter (INDEPENDENT_AMBULATORY_CARE_PROVIDER_SITE_OTHER): Payer: Self-pay | Admitting: Obstetrics & Gynecology

## 2019-07-27 ENCOUNTER — Ambulatory Visit (INDEPENDENT_AMBULATORY_CARE_PROVIDER_SITE_OTHER): Payer: BC Managed Care – PPO | Admitting: Obstetrics & Gynecology

## 2019-07-27 VITALS — BP 111/75 | HR 77 | Temp 97.6°F | Ht 68.0 in | Wt 162.0 lb

## 2019-07-27 DIAGNOSIS — Z3201 Encounter for pregnancy test, result positive: Secondary | ICD-10-CM

## 2019-07-27 DIAGNOSIS — Z3200 Encounter for pregnancy test, result unknown: Secondary | ICD-10-CM

## 2019-07-27 LAB — CBC
Absolute NRBC: 0 10*3/uL (ref 0.00–0.00)
Hematocrit: 37.9 % (ref 34.7–43.7)
Hgb: 12.4 g/dL (ref 11.4–14.8)
MCH: 29.6 pg (ref 25.1–33.5)
MCHC: 32.7 g/dL (ref 31.5–35.8)
MCV: 90.5 fL (ref 78.0–96.0)
MPV: 11.5 fL (ref 8.9–12.5)
Nucleated RBC: 0 /100 WBC (ref 0.0–0.0)
Platelets: 215 10*3/uL (ref 142–346)
RBC: 4.19 10*6/uL (ref 3.90–5.10)
RDW: 12 % (ref 11–15)
WBC: 9.02 10*3/uL (ref 3.10–9.50)

## 2019-07-27 LAB — HIV-1/2 AG/AB 4TH GEN. W/ REFLEX: HIV Ag/Ab, 4th Generation: NONREACTIVE

## 2019-07-27 LAB — ABO/RH: ABO Rh: O POS

## 2019-07-27 LAB — HEMOLYSIS INDEX: Hemolysis Index: 4 (ref 0–18)

## 2019-07-27 LAB — IRON PROFILE
Iron Saturation: 26 % (ref 15–50)
Iron: 76 ug/dL (ref 40–145)
TIBC: 297 ug/dL (ref 265–497)
UIBC: 221 ug/dL (ref 126–382)

## 2019-07-27 LAB — ANTIBODY SCREEN: AB Screen Gel: NEGATIVE

## 2019-07-27 LAB — SYPHILIS SCREEN IGG AND IGM: Syphilis Screen IgG and IgM: NONREACTIVE

## 2019-07-27 LAB — RUBELLA ANTIBODY, IGG: Rubella AB, IgG: 2.04

## 2019-07-27 LAB — HEPATITIS B SURFACE ANTIGEN W/ REFLEX TO CONFIRMATION: Hepatitis B Surface Antigen: NONREACTIVE

## 2019-07-27 NOTE — Progress Notes (Signed)
Chief Complaint: Confirmation of pregnancy    History of Present Illness: Kristen Santana is a 29 y.o. female, G2P1011, presenting @ 8w 1d by LMP for pregnancy confirmation.  Patient's last menstrual period was 05/31/2019 (exact date)..  She is feeling well today, but reports nausea w/occ episodes of vomiting and fatigue. She has tried Sea-bands and ginger without relief. She reports resolution of early breast tenderness. She reports intermittent light spotting yesterday noticed after wiping w/o vaginal bleeding, but none since. She denies increased vaginal discharge w/o associated bothersome sx.  She denies abdominal cramping. She denies issues with constipation.     G1 labored for 14 hours s/p NSVD in [place].     Denies hx of outdoor cat exposure.   Denies hx of genetic, bleeding, endocrine, chromosome disorder in herself or her partner Smitty Cords).   Denies history of congenital anomalies or mental retardation, though reports FHx of multiple gestations on her mother's side.  Partner's mother w/o hx of GHTN or PEC.    Pregnancy Complications:  1. Bicornuate/septate appearing uterus   2. First trim N/V    Gynecologic History:   Menses were regular prior to pregnancy w/o IM spotting  Reports remote h/o STDs (+CT) and remote h/o abnormal pap smears (+HPV age 66)  Last pap smear was in 2020 and it was normal.  Denies hx of UTIs, denies hx of vaginits/tendency toward BV and Yeast infx    Obstetric History:   OB History   Gravida Para Term Preterm AB Living   2 1 1   1 1    SAB TAB Ectopic Multiple Live Births     1     1      # Outcome Date GA Lbr Len/2nd Weight Sex Delivery Anes PTL Lv   2 Term 2016    F Vag-Spont   LIV   1 TAB 2012     TAB          Past Medical History:  Past Medical History:   Diagnosis Date    Abnormal Pap smear of cervix     age 41    History of chlamydia 2009    Human papilloma virus infection            Past Surgical History:  Past Surgical History:   Procedure Laterality Date    WISDOM  TOOTH EXTRACTION         Social History:  Social History     Socioeconomic History    Marital status: Significant Other     Spouse name: Not on file    Number of children: Not on file    Years of education: Not on file    Highest education level: Not on file   Occupational History    Not on file   Social Needs    Financial resource strain: Not on file    Food insecurity     Worry: Not on file     Inability: Not on file    Transportation needs     Medical: Not on file     Non-medical: Not on file   Tobacco Use    Smoking status: Never Smoker    Smokeless tobacco: Never Used   Substance and Sexual Activity    Alcohol use: Not Currently    Drug use: Never    Sexual activity: Yes   Lifestyle    Physical activity     Days per week: Not on file     Minutes per session:  Not on file    Stress: Not on file   Relationships    Social connections     Talks on phone: Not on file     Gets together: Not on file     Attends religious service: Not on file     Active member of club or organization: Not on file     Attends meetings of clubs or organizations: Not on file     Relationship status: Not on file    Intimate partner violence     Fear of current or ex partner: Not on file     Emotionally abused: Not on file     Physically abused: Not on file     Forced sexual activity: Not on file   Other Topics Concern    Not on file   Social History Narrative    Not on file       Family History:  Family History   Problem Relation Age of Onset    Prostate cancer Paternal Grandfather     Gallbladder disease Paternal Grandmother     Cancer Paternal Grandmother     Hypertension Maternal Grandmother     No known problems Father     No known problems Mother     Hypertension Paternal Uncle     Colon cancer Neg Hx     Diabetes Neg Hx     Eclampsia Neg Hx     Stroke Neg Hx     Ovarian cancer Neg Hx        Allergies: No Known Allergies    Medications:   Current Outpatient Medications   Medication Sig Dispense Refill     Prenatal Vit-Fe Fumarate-FA (Prenatal Vitamin) 27-0.8 MG Tab Take 1 tablet by mouth daily       No current facility-administered medications for this visit.        Review of Systems:  General ROS: negative for fatigue, fever/chills, weight loss  Breast ROS: negative for breast lumps, nipple discharge  Gastrointestinal ROS: no abdominal pain, change in bowel habits  Genitourinary ROS: no dysuria, trouble voiding, or hematuria  Skin: denies rash or lesion  Psych: denies depression, denies anxiety  All other systems reviewed and, with exception of those noted in HPI, are negative     Physical Exam:   BP 111/75    Pulse 77    Temp 97.6 F (36.4 C)    Ht 5\' 8"  (1.727 m)    Wt 162 lb (73.5 kg)    LMP 05/31/2019 (Exact Date)    BMI 24.63 kg/m   General appearance - alert, well appearing, and in no distress, normal body habitus, well-dressed and well-groomed  Respiratory - breathing non-labored w/nml inspiratory and expiratory effort  HEENT: no lymphadenopathy, no thyromegaly  Breast: symmetric w/nml contour, glandular central breast tissue c/w early pregnancy, non-tender w/o nipple discharge, no discrete/distinct masses, no axillary masses/fullness  Abdomen - soft, nontender, nondistended, no masses, no rebound, no guarding  Extremities: no signs of clubbing or edema, FROM x4  Pelvic exam:  VULVA: normal appearing vulva with no masses, shaven w/o folliculitis, no tenderness or lesions, normal clitoris  VAGINA: normal appearing vaginal mucosa with normal color and scant amount of physiologic-appearing discharge, no lesions  CERVIX: normal appearing cervix without motion tenderness, discharge or lesions. No pap collected.  UTERUS: uterus is normal size (c/w 9wks by [place]), norm shape, normal consistency and mobility, non-tender, anteverted  ADNEXA:  nontender and no masses.  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 Single intrauterine pregnancy with cardiac activity at 150 bpm. CRL is AGA (1.37 cm) consistent with 7 w 4d. [place] EDD= 03/10/20.     Assessment:  1. Visit for confirmation of pregnancy test result with physical exam        Plan:  -Viable Singleton IUP confirmed with EDD 03/06/20 based on LMP and today's [place] (4 day discrepancy from LMP).   With confirmation of early pregnancy <10w, confirm labs ordered w/IPV labs.  Options for first trimester aneuploidy screening briefly reviewed, including Nuchal Translucency/FTS and NIPT in setting of patient's age <35.  - Patient interested in cfDNA  - Discussed potential variations in cost/insurance coverage of NIPT in low-risk patients, with current rate of our preferred platform.  - Referral to ATC for consultation and ultrasound anticipated for w/NT assessment at 11-13 wks  Questions answered re: normal first trimester symptoms/expectations and options for management of fatigue, nausea/vomiting and changes in appetite.  - discussed difference between spotting/bleeding and indications to notify provider/present for evaluation  - reviewed dietary modifications, activity/exercise allowances/restrictions and acceptable supplements/OTC medications for pregnancy   Reviewed routine prenatal care, oriented to practice, shared call model and delivery hospital [facility]).   - Discussed age-related risks for aneuploidy and other risks during pregnancy related to aforementioned risk factors/prior pregnancy complications   -reviewed need for baseline PEC labs and possible LDA qHS this pregnancy if evidence of elevated UtA dopplers or low PAPP-A on FTS  - Discussed goal weight gain of ~25 lbs.   - Flu shot advised w/D3 supplementation and review of COVID vaccine recommendations for pregnancy in setting of global pandemic    - All questions answered and  anticipated AVS reviewed with the patient.    - Follow up in 2 weeks for IPV viability verification, sooner PRN.    - will f/u outstanding serology results with management as indicated   - Utility of MyChart/EMR portal info reiterated     Reginia Forts, MD, MPH  [facility] ([facility]     The total visit time spent face to face with the patient in consultation was 30 minutes, with over 50% of the time spent in counseling and discussion/coordination of care with the patient. Topics discussed are as noted above.

## 2019-07-28 LAB — HCG QUANTITATIVE: hCG, Quant.: 146846.1 — ABNORMAL HIGH (ref 0.0–4.9)

## 2019-07-28 LAB — PROGESTERONE: Progesterone: 7 ng/mL

## 2019-07-30 ENCOUNTER — Encounter (INDEPENDENT_AMBULATORY_CARE_PROVIDER_SITE_OTHER): Payer: Self-pay | Admitting: Obstetrics & Gynecology

## 2019-07-31 ENCOUNTER — Other Ambulatory Visit (INDEPENDENT_AMBULATORY_CARE_PROVIDER_SITE_OTHER): Payer: Self-pay | Admitting: Obstetrics & Gynecology

## 2019-07-31 ENCOUNTER — Encounter (INDEPENDENT_AMBULATORY_CARE_PROVIDER_SITE_OTHER): Payer: BC Managed Care – PPO | Admitting: Obstetrics & Gynecology

## 2019-07-31 DIAGNOSIS — O219 Vomiting of pregnancy, unspecified: Secondary | ICD-10-CM

## 2019-07-31 MED ORDER — PRENATAL VITAMIN 27-0.8 MG PO TABS
1.00 | ORAL_TABLET | Freq: Every day | ORAL | 3 refills | Status: AC
Start: 2019-07-31 — End: ?

## 2019-07-31 MED ORDER — DOXYLAMINE-PYRIDOXINE 10-10 MG PO TBEC
2.00 | DELAYED_RELEASE_TABLET | Freq: Every evening | ORAL | 5 refills | Status: AC
Start: 2019-07-31 — End: 2020-01-27

## 2019-08-01 LAB — URINE CHLAMYDIA/NEISSERIA BY PCR
Chlamydia DNA by PCR: NEGATIVE
Neisseria gonorrhoeae by PCR: NEGATIVE

## 2019-08-08 ENCOUNTER — Encounter (INDEPENDENT_AMBULATORY_CARE_PROVIDER_SITE_OTHER): Payer: Self-pay | Admitting: Obstetrics & Gynecology

## 2019-08-09 ENCOUNTER — Telehealth (INDEPENDENT_AMBULATORY_CARE_PROVIDER_SITE_OTHER): Payer: Self-pay

## 2019-08-09 NOTE — Telephone Encounter (Signed)
Returned pt's call regarding needing a root canal and crown. Pt is [redacted] weeks pregnant and asking if this is safe to have done. Communicated to pt that it is safe and that the local anesthetic should be lidocaine without epinephrine. If xrays are needed, pt would need to wear an abdominal shield. Pt verbalized understanding.

## 2019-08-16 ENCOUNTER — Ambulatory Visit (INDEPENDENT_AMBULATORY_CARE_PROVIDER_SITE_OTHER): Payer: BC Managed Care – PPO | Admitting: Obstetrics & Gynecology

## 2019-08-16 ENCOUNTER — Encounter (INDEPENDENT_AMBULATORY_CARE_PROVIDER_SITE_OTHER): Payer: Self-pay | Admitting: Obstetrics & Gynecology

## 2019-08-16 VITALS — BP 113/69 | HR 73 | Temp 97.7°F | Wt 161.2 lb

## 2019-08-16 DIAGNOSIS — O468X1 Other antepartum hemorrhage, first trimester: Secondary | ICD-10-CM

## 2019-08-16 DIAGNOSIS — O418X10 Other specified disorders of amniotic fluid and membranes, first trimester, not applicable or unspecified: Secondary | ICD-10-CM

## 2019-08-16 DIAGNOSIS — O0990 Supervision of high risk pregnancy, unspecified, unspecified trimester: Secondary | ICD-10-CM

## 2019-08-16 DIAGNOSIS — Q51818 Other congenital malformations of uterus: Secondary | ICD-10-CM

## 2019-08-16 NOTE — Progress Notes (Signed)
Daily Bayman is a 29 y.o.AfAm female at [redacted]w[redacted]d presenting for high risk OB care in the first trimester. On evaluation today, the patient has no new OB issues/concerns. She reports feeling well, with N/V completely resolved with Diclegis. She denies vaginal bleeding/spotting, leakage of fluid, and abdominal pain/contractions. She is not yet aware of fetal movement. She reports symptoms of a yeast infection.     Pregnancy Complications:   1. Bicornuate/septate appearing uterus   2. First trim N/V  3. 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 Single intrauterine pregnancy with cardiac activity at 150 bpm. CRL is AGA (1.37 cm) consistent with 7 w 4d. [place] EDD= 03/10/20.   Immunizations: Flu shot and Tdap advised.    O: BP 113/69    Pulse 73    Temp 97.7 F (36.5 C)    Wt 161 lb 3.2 oz (73.1 kg)    LMP 05/31/2019 (Exact Date)    BMI 24.51 kg/m   See flow sheet    TAUS: The embryonic crown-rump length is AGA (3.78 cm) and calculates to an estimated gestational age of [redacted] weeks, 5 days, with a calculated EDD of 03/08/2020. Embryonic cardiac activity is seen at a rate of 165 b/min.    A/P: 29 y.o. 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   - Patient interested in cfDNA with anticipated collection early next week per lab tech availability  - Discussed goal weight gain for moms with normal BMI  Vulvovaginal sx c/w candidiasis  Rx sent for treatment of both vaginal and vulvar components  Pelvic  hygeine to reduce recurrence risk also reviewed including vaginal probiotic and dilute external ACV rinses  - Flu shot advised w/ discussion of COVID vaccine and D3 supplementation in setting of global pandemic  RTO in 4 wks for RPV   - Indications to notify provider/present for earlier evaluation reviewed    Reginia Forts, MD, MPH  [facility] ([facility]

## 2019-08-17 ENCOUNTER — Other Ambulatory Visit (INDEPENDENT_AMBULATORY_CARE_PROVIDER_SITE_OTHER): Payer: Self-pay | Admitting: Obstetrics & Gynecology

## 2019-08-17 DIAGNOSIS — Z3682 Encounter for antenatal screening for nuchal translucency: Secondary | ICD-10-CM

## 2019-08-20 ENCOUNTER — Encounter (INDEPENDENT_AMBULATORY_CARE_PROVIDER_SITE_OTHER): Payer: Self-pay | Admitting: Obstetrics & Gynecology

## 2019-08-20 ENCOUNTER — Other Ambulatory Visit: Payer: BC Managed Care – PPO

## 2019-08-20 DIAGNOSIS — O468X1 Other antepartum hemorrhage, first trimester: Secondary | ICD-10-CM | POA: Insufficient documentation

## 2019-08-20 DIAGNOSIS — Q51818 Other congenital malformations of uterus: Secondary | ICD-10-CM | POA: Insufficient documentation

## 2019-08-20 MED ORDER — CLOTRIMAZOLE-BETAMETHASONE 1-0.05 % EX CREA
TOPICAL_CREAM | Freq: Two times a day (BID) | CUTANEOUS | 1 refills | Status: AC | PRN
Start: 2019-08-20 — End: ?

## 2019-08-20 MED ORDER — FLUCONAZOLE 150 MG PO TABS
ORAL_TABLET | ORAL | 2 refills | Status: AC
Start: 2019-08-20 — End: 2020-08-19

## 2019-08-22 ENCOUNTER — Encounter: Payer: Self-pay | Admitting: Obstetrics & Gynecology

## 2019-08-31 ENCOUNTER — Encounter (INDEPENDENT_AMBULATORY_CARE_PROVIDER_SITE_OTHER): Payer: Self-pay | Admitting: Obstetrics & Gynecology

## 2019-08-31 ENCOUNTER — Encounter (HOSPITAL_BASED_OUTPATIENT_CLINIC_OR_DEPARTMENT_OTHER): Payer: BC Managed Care – PPO

## 2019-09-03 ENCOUNTER — Encounter (INDEPENDENT_AMBULATORY_CARE_PROVIDER_SITE_OTHER): Payer: Self-pay | Admitting: Obstetrics & Gynecology

## 2019-09-05 ENCOUNTER — Telehealth: Payer: Self-pay | Admitting: Obstetrics & Gynecology

## 2019-09-05 ENCOUNTER — Encounter (HOSPITAL_BASED_OUTPATIENT_CLINIC_OR_DEPARTMENT_OTHER): Payer: BC Managed Care – PPO

## 2019-09-05 ENCOUNTER — Telehealth (INDEPENDENT_AMBULATORY_CARE_PROVIDER_SITE_OTHER): Payer: Self-pay

## 2019-09-05 DIAGNOSIS — Z9189 Other specified personal risk factors, not elsewhere classified: Secondary | ICD-10-CM

## 2019-09-05 NOTE — Telephone Encounter (Signed)
Late entry from 09/04/19:  Pt called stating she has a scheduled termination of pregnancy on Friday 09/07/19 and wants to know if it is safe to get her covid vaccine the day after. Communicated to pt that Clinical research associate will discuss with Dr. Wellington Hampshire.    DW Dr. Wellington Hampshire who is to call and discuss with patient.
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
## 2019-09-13 ENCOUNTER — Ambulatory Visit (INDEPENDENT_AMBULATORY_CARE_PROVIDER_SITE_OTHER): Payer: BC Managed Care – PPO | Admitting: Obstetrics & Gynecology

## 2019-10-26 ENCOUNTER — Telehealth (INDEPENDENT_AMBULATORY_CARE_PROVIDER_SITE_OTHER): Payer: Self-pay

## 2019-10-26 NOTE — Telephone Encounter (Signed)
Called [employer] to follow up regarding prior authorization status for pt's NIPT as office received prior auth forms from [place]. Spoke with Tiara from prior auth department, who states prior Berkley Harvey is still in process. An EOB will be sent out once prior auth is completed.

## 2019-12-25 ENCOUNTER — Encounter (INDEPENDENT_AMBULATORY_CARE_PROVIDER_SITE_OTHER): Payer: Self-pay | Admitting: Obstetrics & Gynecology

## 2020-06-02 ENCOUNTER — Encounter (HOSPITAL_BASED_OUTPATIENT_CLINIC_OR_DEPARTMENT_OTHER): Payer: Self-pay
